# Patient Record
Sex: Female | Born: 1965
Health system: Southern US, Community
[De-identification: ages and names within clinical notes are randomized; demographics above are authoritative.]

## PROBLEM LIST (undated history)

## (undated) DIAGNOSIS — F419 Anxiety disorder, unspecified: Secondary | ICD-10-CM

## (undated) DIAGNOSIS — K529 Noninfective gastroenteritis and colitis, unspecified: Secondary | ICD-10-CM

## (undated) DIAGNOSIS — Z923 Personal history of irradiation: Secondary | ICD-10-CM

## (undated) DIAGNOSIS — T4145XA Adverse effect of unspecified anesthetic, initial encounter: Secondary | ICD-10-CM

## (undated) DIAGNOSIS — Z9221 Personal history of antineoplastic chemotherapy: Secondary | ICD-10-CM

## (undated) DIAGNOSIS — C801 Malignant (primary) neoplasm, unspecified: Secondary | ICD-10-CM

## (undated) DIAGNOSIS — R112 Nausea with vomiting, unspecified: Secondary | ICD-10-CM

## (undated) DIAGNOSIS — Z9889 Other specified postprocedural states: Secondary | ICD-10-CM

## (undated) DIAGNOSIS — D649 Anemia, unspecified: Secondary | ICD-10-CM

## (undated) DIAGNOSIS — F4310 Post-traumatic stress disorder, unspecified: Secondary | ICD-10-CM

## (undated) DIAGNOSIS — C50411 Malignant neoplasm of upper-outer quadrant of right female breast: Secondary | ICD-10-CM

## (undated) DIAGNOSIS — T8859XA Other complications of anesthesia, initial encounter: Secondary | ICD-10-CM

## (undated) DIAGNOSIS — K5190 Ulcerative colitis, unspecified, without complications: Secondary | ICD-10-CM

## (undated) HISTORY — PX: WISDOM TOOTH EXTRACTION: SHX21

## (undated) HISTORY — PX: COLONOSCOPY: SHX174

---

## 1998-11-21 HISTORY — PX: PARTIAL COLECTOMY: SHX5273

## 2002-11-21 HISTORY — PX: BREAST REDUCTION SURGERY: SHX8

## 2005-11-21 HISTORY — PX: ANTERIOR CRUCIATE LIGAMENT REPAIR: SHX115

## 2009-02-11 ENCOUNTER — Encounter: Admission: RE | Admit: 2009-02-11 | Discharge: 2009-03-04 | Payer: Self-pay | Admitting: Family Medicine

## 2009-03-04 ENCOUNTER — Encounter: Admission: RE | Admit: 2009-03-04 | Discharge: 2009-03-04 | Payer: Self-pay | Admitting: Physician Assistant

## 2009-03-24 ENCOUNTER — Encounter: Admission: RE | Admit: 2009-03-24 | Discharge: 2009-03-24 | Payer: Self-pay | Admitting: Obstetrics and Gynecology

## 2009-12-25 ENCOUNTER — Encounter: Admission: RE | Admit: 2009-12-25 | Discharge: 2009-12-25 | Payer: Self-pay | Admitting: Obstetrics and Gynecology

## 2010-06-23 ENCOUNTER — Encounter: Admission: RE | Admit: 2010-06-23 | Discharge: 2010-06-23 | Payer: Self-pay | Admitting: Obstetrics and Gynecology

## 2012-06-04 ENCOUNTER — Other Ambulatory Visit: Payer: Self-pay | Admitting: Obstetrics and Gynecology

## 2012-06-04 DIAGNOSIS — N63 Unspecified lump in unspecified breast: Secondary | ICD-10-CM

## 2012-06-07 ENCOUNTER — Ambulatory Visit
Admission: RE | Admit: 2012-06-07 | Discharge: 2012-06-07 | Disposition: A | Discharge status: Home / Self Care | Payer: 59 | Source: Ambulatory Visit | Attending: Obstetrics and Gynecology | Admitting: Obstetrics and Gynecology

## 2012-06-07 ENCOUNTER — Ambulatory Visit
Admission: RE | Admit: 2012-06-07 | Discharge: 2012-06-07 | Disposition: A | Discharge status: Home / Self Care | Payer: Self-pay | Source: Ambulatory Visit | Attending: Obstetrics and Gynecology | Admitting: Obstetrics and Gynecology

## 2012-06-07 DIAGNOSIS — N63 Unspecified lump in unspecified breast: Secondary | ICD-10-CM

## 2012-11-21 HISTORY — PX: REDUCTION MAMMAPLASTY: SUR839

## 2012-12-18 ENCOUNTER — Other Ambulatory Visit: Payer: Self-pay | Admitting: Internal Medicine

## 2012-12-18 DIAGNOSIS — N644 Mastodynia: Secondary | ICD-10-CM

## 2012-12-28 ENCOUNTER — Ambulatory Visit
Admission: RE | Admit: 2012-12-28 | Discharge: 2012-12-28 | Disposition: A | Discharge status: Home / Self Care | Payer: 59 | Source: Ambulatory Visit | Attending: Internal Medicine | Admitting: Internal Medicine

## 2012-12-28 DIAGNOSIS — N644 Mastodynia: Secondary | ICD-10-CM

## 2014-05-30 ENCOUNTER — Other Ambulatory Visit: Payer: Self-pay | Admitting: Orthopaedic Surgery

## 2014-06-04 ENCOUNTER — Encounter (HOSPITAL_BASED_OUTPATIENT_CLINIC_OR_DEPARTMENT_OTHER): Payer: Self-pay | Admitting: *Deleted

## 2014-06-04 NOTE — H&P (Signed)
Gina Dickson is an 48 y.o. female.   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  She had an ACL reconstruction years ago elsewhere which worked out very well.  Unfortunately a couple of weeks ago she was playing tennis and twisted her knee.  She has been unable to straighten it since.  They had to carry her off the court.  She continues on 2 crutches. She is noted to have a displaced meniscal tear by MRI.  She is having some moderate pain.  Imaging/Tests: I reviewed an MRI scan report only of a study done at [facility] on 05/19/14.  This shows a displaced bucket-handle tear of the medial meniscus with an intact ACL graft.    No past medical history on file.  No past surgical history on file.  No family history on file. Social History:  has no tobacco, alcohol, and drug history on file.  Allergies: No Known Allergies  No prescriptions prior to admission    No results found for this or any previous visit (from the past 48 hour(s)). No results found.  Review of Systems  Musculoskeletal: Positive for joint pain.       Right knee    Height 5\' 5"  (1.651 m), weight 72.576 kg (160 lb). Physical Exam  Constitutional: She is oriented to person, place, and time. She appears well-developed and well-nourished.  HENT:  Head: Normocephalic and atraumatic.  Eyes: Conjunctivae and EOM are normal. Pupils are equal, round, and reactive to light.  Neck: Normal range of motion.  Cardiovascular: Normal rate and regular rhythm.   Respiratory: Effort normal.  GI: Soft.  Musculoskeletal:  Right knee has trace effusion with small incisions consistent with an allograft ACL reconstruction.  Her motion is from about 15-80.  Lachman's test feels tight but she does guard significantly and does not come out in full extension.  Sensation and motor function are intact in her feet with palpable pulses on both sides.  There is no palpable  lymphadenopathy behind her knee.  Neurological: She is alert and oriented to person, place, and time.  Skin: Skin is warm and dry.  Psychiatric: She has a normal mood and affect. Her behavior is normal. Judgment and thought content normal.     Assessment/Plan Assessment:   Right knee displaced meniscal tear with previous allograft ACL reconstruction  Plan: Gina Dickson unfortunately has a locked knee.  There is not much of an alternative to arthroscopic intervention.  I told her our plan will be to perform a partial medial meniscectomy versus repair.  I doubt her ACL is torn but if it is we would not reconstruct at the same time and would simply have her go through some rehabilitation until she got full motion back and then perhaps reconstruct.  I really doubt that is going to be necessary.  I reviewed risk of anesthesia and infection related to a knee arthroscopy.She will continue on crutches until surgery.  Gina Dickson, Larwance Sachs 06/04/2014, 10:46 AM

## 2014-06-04 NOTE — Progress Notes (Signed)
Surgery chg-son coming-mom was flying in later-no labs needed-to bring crutches

## 2014-06-06 ENCOUNTER — Encounter (HOSPITAL_COMMUNITY): Payer: Self-pay | Admitting: Pharmacy Technician

## 2014-06-09 ENCOUNTER — Inpatient Hospital Stay (HOSPITAL_COMMUNITY): Admission: RE | Admit: 2014-06-09 | Payer: 59 | Source: Ambulatory Visit

## 2014-06-09 ENCOUNTER — Ambulatory Visit (HOSPITAL_BASED_OUTPATIENT_CLINIC_OR_DEPARTMENT_OTHER)
Admission: RE | Admit: 2014-06-09 | Discharge: 2014-06-09 | Disposition: A | Discharge status: Home / Self Care | Payer: 59 | Source: Ambulatory Visit | Attending: Orthopaedic Surgery | Admitting: Orthopaedic Surgery

## 2014-06-09 ENCOUNTER — Ambulatory Visit (HOSPITAL_BASED_OUTPATIENT_CLINIC_OR_DEPARTMENT_OTHER): Payer: 59 | Admitting: Anesthesiology

## 2014-06-09 ENCOUNTER — Encounter (HOSPITAL_BASED_OUTPATIENT_CLINIC_OR_DEPARTMENT_OTHER)
Admission: RE | Disposition: A | Discharge status: Home / Self Care | Payer: Self-pay | Source: Ambulatory Visit | Attending: Orthopaedic Surgery

## 2014-06-09 ENCOUNTER — Encounter (HOSPITAL_BASED_OUTPATIENT_CLINIC_OR_DEPARTMENT_OTHER): Payer: Self-pay

## 2014-06-09 ENCOUNTER — Encounter (HOSPITAL_BASED_OUTPATIENT_CLINIC_OR_DEPARTMENT_OTHER): Payer: 59 | Admitting: Anesthesiology

## 2014-06-09 DIAGNOSIS — Y9239 Other specified sports and athletic area as the place of occurrence of the external cause: Secondary | ICD-10-CM | POA: Insufficient documentation

## 2014-06-09 DIAGNOSIS — IMO0002 Reserved for concepts with insufficient information to code with codable children: Secondary | ICD-10-CM | POA: Insufficient documentation

## 2014-06-09 DIAGNOSIS — M2240 Chondromalacia patellae, unspecified knee: Secondary | ICD-10-CM | POA: Insufficient documentation

## 2014-06-09 DIAGNOSIS — Y92838 Other recreation area as the place of occurrence of the external cause: Secondary | ICD-10-CM

## 2014-06-09 DIAGNOSIS — X500XXA Overexertion from strenuous movement or load, initial encounter: Secondary | ICD-10-CM | POA: Insufficient documentation

## 2014-06-09 DIAGNOSIS — S83206A Unspecified tear of unspecified meniscus, current injury, right knee, initial encounter: Secondary | ICD-10-CM

## 2014-06-09 DIAGNOSIS — Y9379 Activity, other specified sports and athletics: Secondary | ICD-10-CM | POA: Insufficient documentation

## 2014-06-09 HISTORY — PX: KNEE ARTHROSCOPY WITH MEDIAL MENISECTOMY: SHX5651

## 2014-06-09 HISTORY — DX: Noninfective gastroenteritis and colitis, unspecified: K52.9

## 2014-06-09 LAB — POCT HEMOGLOBIN-HEMACUE: Hemoglobin: 12.1 g/dL (ref 12.0–15.0)

## 2014-06-09 SURGERY — ARTHROSCOPY, KNEE, WITH MEDIAL MENISCECTOMY
Anesthesia: General | Site: Knee | Laterality: Right

## 2014-06-09 MED ORDER — SUCCINYLCHOLINE CHLORIDE 20 MG/ML IJ SOLN
INTRAMUSCULAR | Status: AC
  Filled 2014-06-09: qty 1

## 2014-06-09 MED ORDER — OXYCODONE HCL 5 MG/5ML PO SOLN: 5.0000 mg | Freq: Once | ORAL | Status: DC | PRN

## 2014-06-09 MED ORDER — CHLORHEXIDINE GLUCONATE 4 % EX LIQD: 60.0000 mL | Freq: Once | CUTANEOUS | Status: DC

## 2014-06-09 MED ORDER — FENTANYL CITRATE 0.05 MG/ML IJ SOLN
INTRAMUSCULAR | Status: AC
  Filled 2014-06-09: qty 6

## 2014-06-09 MED ORDER — BUPIVACAINE-EPINEPHRINE 0.5% -1:200000 IJ SOLN
INTRAMUSCULAR | Status: DC | PRN
  Administered 2014-06-09: 20 mL

## 2014-06-09 MED ORDER — ONDANSETRON HCL 4 MG/2ML IJ SOLN
4.0000 mg | Freq: Once | INTRAMUSCULAR | Status: AC | PRN
  Administered 2014-06-09: 4 mg via INTRAVENOUS

## 2014-06-09 MED ORDER — FENTANYL CITRATE 0.05 MG/ML IJ SOLN: 50.0000 ug | INTRAMUSCULAR | Status: DC | PRN

## 2014-06-09 MED ORDER — FENTANYL CITRATE 0.05 MG/ML IJ SOLN
INTRAMUSCULAR | Status: DC | PRN
  Administered 2014-06-09: 100 ug via INTRAVENOUS
  Administered 2014-06-09: 50 ug via INTRAVENOUS

## 2014-06-09 MED ORDER — ONDANSETRON HCL 4 MG/2ML IJ SOLN
INTRAMUSCULAR | Status: AC
  Filled 2014-06-09: qty 2

## 2014-06-09 MED ORDER — LIDOCAINE HCL (CARDIAC) 20 MG/ML IV SOLN
INTRAVENOUS | Status: DC | PRN
  Administered 2014-06-09: 100 mg via INTRAVENOUS

## 2014-06-09 MED ORDER — CEFAZOLIN SODIUM-DEXTROSE 2-3 GM-% IV SOLR
2.0000 g | INTRAVENOUS | Status: AC
  Administered 2014-06-09: 2 g via INTRAVENOUS

## 2014-06-09 MED ORDER — BUPIVACAINE-EPINEPHRINE (PF) 0.5% -1:200000 IJ SOLN
INTRAMUSCULAR | Status: AC
  Filled 2014-06-09: qty 30

## 2014-06-09 MED ORDER — CEFAZOLIN SODIUM-DEXTROSE 2-3 GM-% IV SOLR
INTRAVENOUS | Status: AC
  Filled 2014-06-09: qty 50

## 2014-06-09 MED ORDER — PROPOFOL 10 MG/ML IV BOLUS
INTRAVENOUS | Status: DC | PRN
  Administered 2014-06-09: 150 mg via INTRAVENOUS

## 2014-06-09 MED ORDER — SCOPOLAMINE 1 MG/3DAYS TD PT72
MEDICATED_PATCH | TRANSDERMAL | Status: AC
  Filled 2014-06-09: qty 1

## 2014-06-09 MED ORDER — LACTATED RINGERS IV SOLN
INTRAVENOUS | Status: DC
  Administered 2014-06-09: 10 mL/h via INTRAVENOUS
  Administered 2014-06-09 (×2): via INTRAVENOUS

## 2014-06-09 MED ORDER — MIDAZOLAM HCL 2 MG/2ML IJ SOLN
INTRAMUSCULAR | Status: AC
  Filled 2014-06-09: qty 2

## 2014-06-09 MED ORDER — HYDROCODONE-ACETAMINOPHEN 5-325 MG PO TABS: 1.0000 | ORAL_TABLET | Freq: Four times a day (QID) | ORAL | Status: DC | PRN

## 2014-06-09 MED ORDER — HYDROMORPHONE HCL PF 1 MG/ML IJ SOLN
INTRAMUSCULAR | Status: AC
  Filled 2014-06-09: qty 1

## 2014-06-09 MED ORDER — MEPERIDINE HCL 25 MG/ML IJ SOLN: 6.2500 mg | INTRAMUSCULAR | Status: DC | PRN

## 2014-06-09 MED ORDER — MIDAZOLAM HCL 2 MG/2ML IJ SOLN: 1.0000 mg | INTRAMUSCULAR | Status: DC | PRN

## 2014-06-09 MED ORDER — DEXAMETHASONE SODIUM PHOSPHATE 4 MG/ML IJ SOLN
INTRAMUSCULAR | Status: DC | PRN
  Administered 2014-06-09: 10 mg via INTRAVENOUS

## 2014-06-09 MED ORDER — HYDROMORPHONE HCL PF 1 MG/ML IJ SOLN
0.2500 mg | INTRAMUSCULAR | Status: DC | PRN
  Administered 2014-06-09: 0.5 mg via INTRAVENOUS
  Administered 2014-06-09: 0.25 mg via INTRAVENOUS

## 2014-06-09 MED ORDER — OXYCODONE HCL 5 MG PO TABS: 5.0000 mg | ORAL_TABLET | Freq: Once | ORAL | Status: DC | PRN

## 2014-06-09 MED ORDER — ONDANSETRON HCL 4 MG/2ML IJ SOLN
INTRAMUSCULAR | Status: DC | PRN
  Administered 2014-06-09: 4 mg via INTRAVENOUS

## 2014-06-09 MED ORDER — METHYLPREDNISOLONE ACETATE 40 MG/ML IJ SUSP
INTRAMUSCULAR | Status: AC
  Filled 2014-06-09: qty 1

## 2014-06-09 MED ORDER — LACTATED RINGERS IV SOLN
INTRAVENOUS | Status: DC
  Administered 2014-06-09: 07:00:00 via INTRAVENOUS

## 2014-06-09 MED ORDER — SODIUM CHLORIDE 0.9 % IR SOLN
Status: DC | PRN
  Administered 2014-06-09: 6000 mL

## 2014-06-09 MED ORDER — METHYLPREDNISOLONE ACETATE 80 MG/ML IJ SUSP
INTRAMUSCULAR | Status: AC
  Filled 2014-06-09: qty 1

## 2014-06-09 MED ORDER — PROPOFOL 10 MG/ML IV EMUL
INTRAVENOUS | Status: AC
  Filled 2014-06-09: qty 50

## 2014-06-09 MED ORDER — MIDAZOLAM HCL 5 MG/5ML IJ SOLN
INTRAMUSCULAR | Status: DC | PRN
  Administered 2014-06-09: 2 mg via INTRAVENOUS

## 2014-06-09 SURGICAL SUPPLY — 46 items
BANDAGE ELASTIC 6 VELCRO ST LF (GAUZE/BANDAGES/DRESSINGS) ×2 IMPLANT
BLADE CUDA 5.5 (BLADE) IMPLANT
BLADE CUDA GRT WHITE 3.5 (BLADE) ×2 IMPLANT
BLADE GREAT WHITE 4.2 (BLADE) ×2 IMPLANT
BNDG GAUZE ELAST 4 BULKY (GAUZE/BANDAGES/DRESSINGS) ×2 IMPLANT
CANISTER SUCT 3000ML (MISCELLANEOUS) IMPLANT
DRAPE ARTHROSCOPY W/POUCH 114 (DRAPES) ×2 IMPLANT
DRAPE U-SHAPE 47X51 STRL (DRAPES) ×2 IMPLANT
DRSG EMULSION OIL 3X3 NADH (GAUZE/BANDAGES/DRESSINGS) ×2 IMPLANT
DURAPREP 26ML APPLICATOR (WOUND CARE) IMPLANT
ELECT MENISCUS 165MM 90D (ELECTRODE) IMPLANT
ELECT REM PT RETURN 9FT ADLT (ELECTROSURGICAL)
ELECTRODE REM PT RTRN 9FT ADLT (ELECTROSURGICAL) IMPLANT
GAUZE SPONGE 4X4 12PLY STRL (GAUZE/BANDAGES/DRESSINGS) ×2 IMPLANT
GLOVE BIO SURGEON STRL SZ 6.5 (GLOVE) ×2 IMPLANT
GLOVE BIO SURGEON STRL SZ8 (GLOVE) ×4 IMPLANT
GLOVE BIO SURGEON STRL SZ8.5 (GLOVE) IMPLANT
GLOVE BIOGEL PI IND STRL 7.0 (GLOVE) ×1 IMPLANT
GLOVE BIOGEL PI IND STRL 8 (GLOVE) ×2 IMPLANT
GLOVE BIOGEL PI IND STRL 8.5 (GLOVE) IMPLANT
GLOVE BIOGEL PI INDICATOR 7.0 (GLOVE) ×1
GLOVE BIOGEL PI INDICATOR 8 (GLOVE) ×2
GLOVE BIOGEL PI INDICATOR 8.5 (GLOVE)
GLOVE EXAM NITRILE MD LF STRL (GLOVE) ×2 IMPLANT
GLOVE SS BIOGEL STRL SZ 8 (GLOVE) IMPLANT
GLOVE SUPERSENSE BIOGEL SZ 8 (GLOVE)
GLOVE SURG SS PI 8.0 STRL IVOR (GLOVE) IMPLANT
GOWN STRL REUS W/ TWL LRG LVL3 (GOWN DISPOSABLE) ×1 IMPLANT
GOWN STRL REUS W/ TWL XL LVL3 (GOWN DISPOSABLE) ×2 IMPLANT
GOWN STRL REUS W/TWL LRG LVL3 (GOWN DISPOSABLE) ×1
GOWN STRL REUS W/TWL XL LVL3 (GOWN DISPOSABLE) ×2
IV NS IRRIG 3000ML ARTHROMATIC (IV SOLUTION) ×4 IMPLANT
KNEE WRAP E Z 3 GEL PACK (MISCELLANEOUS) ×2 IMPLANT
MANIFOLD NEPTUNE II (INSTRUMENTS) ×2 IMPLANT
PACK ARTHROSCOPY DSU (CUSTOM PROCEDURE TRAY) ×2 IMPLANT
PACK BASIN DAY SURGERY FS (CUSTOM PROCEDURE TRAY) ×2 IMPLANT
PENCIL BUTTON HOLSTER BLD 10FT (ELECTRODE) IMPLANT
SET ARTHROSCOPY TUBING (MISCELLANEOUS) ×1
SET ARTHROSCOPY TUBING LN (MISCELLANEOUS) ×1 IMPLANT
SHEET MEDIUM DRAPE 40X70 STRL (DRAPES) ×2 IMPLANT
SYRINGE 3CC/18X1.5 ECLIPSE (MISCELLANEOUS) IMPLANT
TOWEL OR 17X24 6PK STRL BLUE (TOWEL DISPOSABLE) ×2 IMPLANT
TOWEL OR NON WOVEN STRL DISP B (DISPOSABLE) IMPLANT
WAND 30 DEG SABER W/CORD (SURGICAL WAND) IMPLANT
WAND STAR VAC 90 (SURGICAL WAND) IMPLANT
WATER STERILE IRR 1000ML POUR (IV SOLUTION) ×2 IMPLANT

## 2014-06-09 NOTE — Interval H&P Note (Signed)
History and Physical Interval Note:  06/09/2014 7:22 AM  Gina Dickson  has presented today for surgery, with the diagnosis of RIGHT KNEE MEDIAL MENISCUS TEAR/CHONDROMALACIA  The various methods of treatment have been discussed with the patient and family. After consideration of risks, benefits and other options for treatment, the patient has consented to  Procedure(s): RIGHT ARTHROSCOPY KNEE (Right) as a surgical intervention .  The patient's history has been reviewed, patient examined, no change in status, stable for surgery.  I have reviewed the patient's chart and labs.  Questions were answered to the patient's satisfaction.     Gina Dickson

## 2014-06-09 NOTE — Discharge Instructions (Signed)

## 2014-06-09 NOTE — Op Note (Signed)
NAMEVerlin Dickson NO.:  [id number]  MEDICAL RECORD NO.:  [id number]  LOCATION:                                 FACILITY:  PHYSICIAN:  Monico Blitz. Vyncent Overby, M.D.DATE OF BIRTH:  07-27-1966  DATE OF PROCEDURE:  06/09/2014 DATE OF DISCHARGE:  06/09/2014                              OPERATIVE REPORT   PREOPERATIVE DIAGNOSES: 1. Right knee torn medial meniscus. 2. Right knee chondromalacia patella.  POSTOPERATIVE DIAGNOSES: 1. Right knee torn medial meniscus. 2. Right knee chondromalacia patella.  PROCEDURE: 1. Right knee partial medial meniscectomy. 2. Right knee abrasion chondroplasty patellofemoral.  ANESTHESIA:  General.  ATTENDING SURGEON:  Monico Blitz. Rhona Raider, MD  ASSISTANT:  Loni Dolly, PA  INDICATION FOR PROCEDURE:  The patient is a 48 year old woman with a history of an ACL reconstruction which worked out well many years ago. Unfortunately, she was playing tennis recently and twisted her knee. She has been left with a locked knee over the last couple of weeks.  She cannot get to last at least 10 degrees of extension.  This causes significant pain.  She underwent an MRI scan, which showed a displaced medial meniscus tear, with an intact ACL graft.  She is offered an arthroscopy.  Informed operative consent was obtained after discussion of possible complications including reaction to anesthesia and infection.  SUMMARY OF FINDINGS AND PROCEDURE:  Under general anesthesia, an arthroscopy of the right knee was performed.  Suprapatellar pouch was benign while the patellofemoral joint exhibited some focal breakdown in the intertrochlear groove addressed with chondroplasty and abrasion to bleeding bone in a tiny area.  The patella tracked well.  The medial compartment exhibited a completely displaced bucket-handle tear of the medial meniscus.  This was stuck in an anterior position and was not reducible.  I performed about a 25% partial medial  meniscectomy removing this entire portion of the meniscus back to a stable posterior rim.  The underlying ACL graft appeared to be intact and was functional to testing.  The PCL was normal.  The lateral compartment exhibited no evidence of meniscal or articular cartilage injury.  She was discharged home the same day.  DESCRIPTION OF PROCEDURE:  The patient was taken to the operating suite where general anesthetic was applied without difficulty.  She was positioned supine and prepped and draped in normal sterile fashion. After the administration of preop IV Kefzol and an appropriate time out, an arthroscopy of the right knee was performed through total of 2 portals.  Findings were as noted above and procedure consisted predominantly of the partial medial meniscectomy which was done with basket and shaver.  We also performed the abrasion chondroplasty patellofemoral described above.  The knee was thoroughly irrigated followed by placement of Marcaine with epinephrine.  Adaptic was placed over the portals followed by dry gauze and a loose Ace wrap.  Estimated blood loss and intraoperative fluids can beobtained from anesthesia records.  DISPOSITION:  The patient was extubated in the operating room and taken to recovery room in stable condition.  She was to go home the same-day and follow up in the office in less than a week.  I will contact her by  phone tonight.     Monico Blitz Rhona Raider, M.D.     PGD/MEDQ  D:  06/09/2014  T:  06/09/2014  Job:  [id number]

## 2014-06-09 NOTE — Anesthesia Procedure Notes (Signed)
Procedure Name: LMA Insertion Date/Time: 06/09/2014 7:38 AM Performed by: Melynda Ripple D Pre-anesthesia Checklist: Patient identified, Emergency Drugs available, Suction available and Patient being monitored Patient Re-evaluated:Patient Re-evaluated prior to inductionOxygen Delivery Method: Circle System Utilized Preoxygenation: Pre-oxygenation with 100% oxygen Intubation Type: IV induction Ventilation: Mask ventilation without difficulty LMA: LMA inserted LMA Size: 4.0 Number of attempts: 1 Airway Equipment and Method: bite block Placement Confirmation: positive ETCO2 Tube secured with: Tape Dental Injury: Teeth and Oropharynx as per pre-operative assessment

## 2014-06-09 NOTE — Anesthesia Preprocedure Evaluation (Signed)
Anesthesia Evaluation  Patient identified by MRN, date of birth, ID band Patient awake    Reviewed: Allergy & Precautions, H&P , NPO status , Patient's Chart, lab work & pertinent test results  History of Anesthesia Complications (+) PONV  Airway Mallampati: I TM Distance: >3 FB Neck ROM: Full    Dental   Pulmonary          Cardiovascular     Neuro/Psych    GI/Hepatic   Endo/Other    Renal/GU      Musculoskeletal   Abdominal   Peds  Hematology   Anesthesia Other Findings   Reproductive/Obstetrics                           Anesthesia Physical Anesthesia Plan  ASA: II  Anesthesia Plan: General   Post-op Pain Management:    Induction: Intravenous  Airway Management Planned: LMA  Additional Equipment:   Intra-op Plan:   Post-operative Plan: Extubation in OR  Informed Consent: I have reviewed the patients History and Physical, chart, labs and discussed the procedure including the risks, benefits and alternatives for the proposed anesthesia with the patient or authorized representative who has indicated his/her understanding and acceptance.     Plan Discussed with: CRNA and Surgeon  Anesthesia Plan Comments:         Anesthesia Quick Evaluation

## 2014-06-09 NOTE — Anesthesia Postprocedure Evaluation (Signed)
Anesthesia Post Note  Patient: Gina Dickson  Procedure(s) Performed: Procedure(s) (LRB): KNEE ARTHROSCOPY WITH PARTIAL MEDIAL MENISECTOMY, CHONDROPLASTY (Right)  Anesthesia type: general  Patient location: PACU  Post pain: Pain level controlled  Post assessment: Patient's Cardiovascular Status Stable  Last Vitals:  Filed Vitals:   06/09/14 1030  BP: 98/62  Pulse:   Temp: 36.4 C  Resp: 16    Post vital signs: Reviewed and stable  Level of consciousness: sedated  Complications: No apparent anesthesia complications

## 2014-06-09 NOTE — Transfer of Care (Signed)
Immediate Anesthesia Transfer of Care Note  Patient: Gina Dickson  Procedure(s) Performed: Procedure(s): KNEE ARTHROSCOPY WITH PARTIAL MEDIAL MENISECTOMY, CHONDROPLASTY (Right)  Patient Location: PACU  Anesthesia Type:General  Level of Consciousness: awake, alert  and oriented  Airway & Oxygen Therapy: Patient Spontanous Breathing and Patient connected to nasal cannula oxygen  Post-op Assessment: Report given to PACU RN and Post -op Vital signs reviewed and stable  Post vital signs: Reviewed and stable  Complications: No apparent anesthesia complications

## 2014-06-09 NOTE — Op Note (Signed)
#  [id number] 

## 2014-06-10 ENCOUNTER — Encounter (HOSPITAL_BASED_OUTPATIENT_CLINIC_OR_DEPARTMENT_OTHER): Payer: Self-pay | Admitting: Orthopaedic Surgery

## 2014-06-12 ENCOUNTER — Ambulatory Visit (HOSPITAL_COMMUNITY): Admission: RE | Admit: 2014-06-12 | Payer: 59 | Source: Ambulatory Visit | Admitting: Orthopaedic Surgery

## 2014-06-12 ENCOUNTER — Encounter (HOSPITAL_COMMUNITY): Admission: RE | Payer: Self-pay | Source: Ambulatory Visit

## 2014-06-12 SURGERY — ARTHROSCOPY, KNEE
Anesthesia: Choice | Site: Knee | Laterality: Right

## 2016-09-09 ENCOUNTER — Other Ambulatory Visit: Payer: Self-pay | Admitting: Obstetrics and Gynecology

## 2016-09-09 DIAGNOSIS — R928 Other abnormal and inconclusive findings on diagnostic imaging of breast: Secondary | ICD-10-CM

## 2016-09-19 ENCOUNTER — Ambulatory Visit
Admission: RE | Admit: 2016-09-19 | Discharge: 2016-09-19 | Disposition: A | Discharge status: Home / Self Care | Payer: Managed Care, Other (non HMO) | Source: Ambulatory Visit | Attending: Obstetrics and Gynecology | Admitting: Obstetrics and Gynecology

## 2016-09-19 ENCOUNTER — Other Ambulatory Visit: Payer: Self-pay | Admitting: Obstetrics and Gynecology

## 2016-09-19 DIAGNOSIS — R928 Other abnormal and inconclusive findings on diagnostic imaging of breast: Secondary | ICD-10-CM

## 2016-09-19 DIAGNOSIS — N6310 Unspecified lump in the right breast, unspecified quadrant: Secondary | ICD-10-CM

## 2016-09-22 ENCOUNTER — Ambulatory Visit
Admission: RE | Admit: 2016-09-22 | Discharge: 2016-09-22 | Disposition: A | Discharge status: Home / Self Care | Payer: Managed Care, Other (non HMO) | Source: Ambulatory Visit | Attending: Obstetrics and Gynecology | Admitting: Obstetrics and Gynecology

## 2016-09-22 ENCOUNTER — Other Ambulatory Visit: Payer: Self-pay | Admitting: Obstetrics and Gynecology

## 2016-09-22 DIAGNOSIS — N6310 Unspecified lump in the right breast, unspecified quadrant: Secondary | ICD-10-CM

## 2016-09-26 ENCOUNTER — Encounter: Payer: Self-pay | Admitting: General Surgery

## 2016-09-29 ENCOUNTER — Other Ambulatory Visit: Payer: Self-pay | Admitting: General Surgery

## 2016-09-29 DIAGNOSIS — C50411 Malignant neoplasm of upper-outer quadrant of right female breast: Secondary | ICD-10-CM

## 2016-09-29 DIAGNOSIS — Z170 Estrogen receptor positive status [ER+]: Secondary | ICD-10-CM

## 2016-10-02 NOTE — H&P (Signed)
Gina Dickson  Location: [facility] Patient #: [id number] DOB: 06/07/1966 Single / Language: Cleophus Molt / Race: White Female        History of Present Illness       This is a very pleasant 50 year old Caucasian female, referred by Dr. Leonides Sake at the [facility] for evaluation of a newly diagnosed cancer in the upper outer quadrant of the right breast. Dr. [name] added Andree Elk farmers her PCP. This morning she saw Dr. Verdell Carmine, no one help oncology specialists in [place]. I have her note.       The patient has had bilateral reduction mammoplasties in the past 2005,but no pathologic breast disease. She gets annual mammograms. Recent screening mammogram and ultrasound on the right showed category C density, 7 mm mass seen at the 10 o'clock position 3 cm from the nipple. Axilla was negative. 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 She has done well since the biopsy. She was presented in our city wide multidisciplinary tumor board this past Wednesday, yesterday. She takes estrogens and has been told to discontinue that by me and by Dr. Georgiann Cocker.       Comorbidities include bilateral reduction mammoplasty 2005. Total colectomy with J-pouch for ulcerative colitis in [facility] and has done well. C-section through Pfannenstiel incision. ACL repair.      Family history reveals a maternal aunt had breast cancer at age 40. Sister had cervical cancer 45. Father had multiple myeloma a 61. Paternal grandfather had gastric cancer. Dr. Georgiann Cocker is going to have this reviewed by her genetic counselor.  Social history reveals she is separated. Has 3 children. Lives in [place]. 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 Denies tobacco. Alcohol rarely.       We did talk about the options for surgical intervention. She knows that surgery  is the next step in her care. We talked about the difference between mastectomy with or without reconstruction, lumpectomy, and sentinel node biopsy. She is motivated for breast conservation and I think she is an excellent candidate for that.       She'll be scheduled for right breast lumpectomy with radioactive seed localization, right axillary sentinel lymph node biopsy. It is possible that we may be able to do this through a circumareolar incision which would be nice cosmetically. I told her I was not sure. I discussed the indications, details, techniques, and numerous risk of the surgery with her. She is aware of the risk of bleeding, infection, cosmetic deformity, nerve damage and chronic pain, reoperation for positive margins or multiple positive nodes. She has that she will probably be offered a whole breast radiation therapy and antiestrogen therapy. Oncotype testing can be considered if the tumor is greater than 1 cm. She understands all of these issues. All of her questions were answered. She agrees with this plan.   Other Problems Breast Cancer Gastric Ulcer Lump In Breast Ulcerative Colitis Umbilical Hernia Repair  Past Surgical History  Breast Biopsy Right. Cesarean Section - 1 Colon Polyp Removal - Colonoscopy Colon Removal - Complete Knee Surgery Right. Mammoplasty; Reduction Bilateral.  Diagnostic Studies History  Colonoscopy 1-5 years ago Mammogram within last year Pap Smear 1-5 years ago  Allergies  Morphine Sulfate *ANALGESICS - OPIOID* Erythrocin Lactobionate *MACROLIDES* COPOLYMER-1  Medication History  Multi-Day (Oral) Active. ZyrTEC Allergy (10MG Tablet, Oral) Active. Fish Oil (Oral) Specific dose  unknown - Active. Magnesium (Oral) Specific dose unknown - Active. Calcium (Oral) Specific dose unknown - Active. Medications Reconciled  Social History  Alcohol use Occasional alcohol use. Caffeine use Coffee, Tea. No drug  use Tobacco use Never smoker.  Family History  Alcohol Abuse Father. Cancer Father. Cervical Cancer Sister. Heart disease in female family member before age 39 Hypertension Father. Ischemic Bowel Disease Father.  Pregnancy / Birth History  Age at menarche 70 years. Contraceptive History Oral contraceptives. Gravida 3 Length (months) of breastfeeding 3-6 Maternal age 35-30 Para 3 Regular periods    Review of Systems  General Present- Night Sweats. Not Present- Appetite Loss, Chills, Fatigue, Fever, Weight Gain and Weight Loss. Skin Not Present- Change in Wart/Mole, Dryness, Hives, Jaundice, New Lesions, Non-Healing Wounds, Rash and Ulcer. HEENT Present- Seasonal Allergies and Wears glasses/contact lenses. Not Present- Earache, Hearing Loss, Hoarseness, Nose Bleed, Oral Ulcers, Ringing in the Ears, Sinus Pain, Sore Throat, Visual Disturbances and Yellow Eyes. Breast Present- Breast Mass. Not Present- Breast Pain, Nipple Discharge and Skin Changes. Cardiovascular Not Present- Chest Pain, Difficulty Breathing Lying Down, Leg Cramps, Palpitations, Rapid Heart Rate, Shortness of Breath and Swelling of Extremities. Gastrointestinal Not Present- Abdominal Pain, Bloating, Bloody Stool, Change in Bowel Habits, Chronic diarrhea, Constipation, Difficulty Swallowing, Excessive gas, Gets full quickly at meals, Hemorrhoids, Indigestion, Nausea, Rectal Pain and Vomiting. Female Genitourinary Not Present- Frequency, Nocturia, Painful Urination, Pelvic Pain and Urgency. Musculoskeletal Not Present- Back Pain, Joint Pain, Joint Stiffness, Muscle Pain, Muscle Weakness and Swelling of Extremities. Neurological Present- Headaches. Not Present- Decreased Memory, Fainting, Numbness, Seizures, Tingling, Tremor, Trouble walking and Weakness. Psychiatric Not Present- Anxiety, Bipolar, Change in Sleep Pattern, Depression, Fearful and Frequent crying. Endocrine Not Present- Cold Intolerance,  Excessive Hunger, Hair Changes, Heat Intolerance, Hot flashes and New Diabetes. Hematology Not Present- Blood Thinners, Easy Bruising, Excessive bleeding, Gland problems, HIV and Persistent Infections.  Vitals  Weight: 163 lb Height: 65in Body Surface Area: 1.81 m Body Mass Index: 27.12 kg/m  Pulse: 80 (Regular)  BP: 110/64 (Sitting, Left Arm, Standard)    Physical Exam General Mental Status-Alert. General Appearance-Consistent with stated age. Hydration-Well hydrated. Voice-Normal.  Head and Neck Head-normocephalic, atraumatic with no lesions or palpable masses. Trachea-midline. Thyroid Gland Characteristics - normal size and consistency.  Eye Eyeball - Bilateral-Extraocular movements intact. Sclera/Conjunctiva - Bilateral-No scleral icterus.  Chest and Lung Exam Chest and lung exam reveals -quiet, even and easy respiratory effort with no use of accessory muscles and on auscultation, normal breath sounds, no adventitious sounds and normal vocal resonance. Inspection Chest Wall - Normal. Back - normal.  Breast Note: Breasts are medium size. Bilateral reduction mammoplasty scars. Good cosmetic result with reasonable symmetry. Biopsy site on the right. No palpable mass or hematoma. No other skin changes. No axillary adenopathy on either side.   Cardiovascular Cardiovascular examination reveals -normal heart sounds, regular rate and rhythm with no murmurs and normal pedal pulses bilaterally.  Abdomen Inspection Inspection of the abdomen reveals - No Hernias. Skin - Scar - Note: Long midline scar from total colectomy. Pfannenstiel incision. Palpation/Percussion Palpation and Percussion of the abdomen reveal - Soft, Non Tender, No Rebound tenderness, No Rigidity (guarding) and No hepatosplenomegaly. Auscultation Auscultation of the abdomen reveals - Bowel sounds normal.  Neurologic Neurologic evaluation reveals -alert and oriented x 3 with  no impairment of recent or remote memory. Mental Status-Normal.  Musculoskeletal Normal Exam - Left-Upper Extremity Strength Normal and Lower Extremity Strength Normal. Normal Exam - Right-Upper Extremity Strength Normal and  Lower Extremity Strength Normal.  Lymphatic Head & Neck  General Head & Neck Lymphatics: Bilateral - Description - Normal. Axillary  General Axillary Region: Bilateral - Description - Normal. Tenderness - Non Tender. Femoral & Inguinal  Generalized Femoral & Inguinal Lymphatics: Bilateral - Description - Normal. Tenderness - Non Tender.    Assessment & Plan PRIMARY CANCER OF UPPER OUTER QUADRANT OF RIGHT FEMALE BREAST (C50.411)   Your recent imaging studies and biopsy show a 7 mm invasive ductal carcinoma in the right breast, upper outer quadrant, 10 o'clock position, 3 cm from the nipple. Ultrasound of the right axilla is negative for obviously enlarged lymph nodes. Your tumor is estrogen receptor positive, HER-2 negative.  This morning you saw Verdell Carmine, [facility] medical oncology, [place]. I will send her a copy of my notes  The next step in your treatment is surgery. Other decisions about radiation therapy, antiestrogen pills, or even chemotherapy will be determined after the surgery is completed. We have discussed the differences between lumpectomy and mastectomy with or without reconstruction as well as sentinel lymph node biopsy. We both agree that we would like to proceed with breast conservation surgery  You will be scheduled for right breast lumpectomy with radioactive seed localization and right axillary sentinel lymph node biopsy I have discussed the indications, techniques, and numerous risk of the surgery Please read the printed information that we have given you.  OTHER ULCERATIVE COLITIS WITH OTHER COMPLICATION (T05.697) Impression: History total proctocolectomy with J-pouch, [place]. HISTORY OF C-SECTION (X48.016) HISTORY  OF BILATERAL BREAST REDUCTION SURGERY (Z98.890) Impression: 2005 FAMILY HISTORY OF BREAST CANCER IN FEMALE (Z80.3) Impression: Maternal aunt, age 42, postmenopausal  Caramia Boutin M. Dalbert Batman, M.D., [facility] General and Minimally invasive Surgery Breast and Colorectal Surgery Office:   [phone] Pager:   [phone]

## 2016-10-04 ENCOUNTER — Encounter (HOSPITAL_COMMUNITY): Payer: Self-pay

## 2016-10-04 ENCOUNTER — Encounter (HOSPITAL_COMMUNITY)
Admission: RE | Admit: 2016-10-04 | Discharge: 2016-10-04 | Disposition: A | Discharge status: Home / Self Care | Payer: Managed Care, Other (non HMO) | Source: Ambulatory Visit | Attending: General Surgery | Admitting: General Surgery

## 2016-10-04 DIAGNOSIS — Z803 Family history of malignant neoplasm of breast: Secondary | ICD-10-CM | POA: Diagnosis not present

## 2016-10-04 DIAGNOSIS — Z800 Family history of malignant neoplasm of digestive organs: Secondary | ICD-10-CM | POA: Diagnosis not present

## 2016-10-04 DIAGNOSIS — Z886 Allergy status to analgesic agent status: Secondary | ICD-10-CM | POA: Diagnosis not present

## 2016-10-04 DIAGNOSIS — Z9049 Acquired absence of other specified parts of digestive tract: Secondary | ICD-10-CM | POA: Diagnosis not present

## 2016-10-04 DIAGNOSIS — Z8719 Personal history of other diseases of the digestive system: Secondary | ICD-10-CM | POA: Diagnosis not present

## 2016-10-04 DIAGNOSIS — D649 Anemia, unspecified: Secondary | ICD-10-CM | POA: Diagnosis not present

## 2016-10-04 DIAGNOSIS — Z86010 Personal history of colonic polyps: Secondary | ICD-10-CM | POA: Diagnosis not present

## 2016-10-04 DIAGNOSIS — Z888 Allergy status to other drugs, medicaments and biological substances status: Secondary | ICD-10-CM | POA: Diagnosis not present

## 2016-10-04 DIAGNOSIS — K5190 Ulcerative colitis, unspecified, without complications: Secondary | ICD-10-CM | POA: Diagnosis not present

## 2016-10-04 DIAGNOSIS — Z170 Estrogen receptor positive status [ER+]: Secondary | ICD-10-CM | POA: Diagnosis not present

## 2016-10-04 DIAGNOSIS — Z8049 Family history of malignant neoplasm of other genital organs: Secondary | ICD-10-CM | POA: Diagnosis not present

## 2016-10-04 DIAGNOSIS — C50411 Malignant neoplasm of upper-outer quadrant of right female breast: Secondary | ICD-10-CM | POA: Diagnosis present

## 2016-10-04 DIAGNOSIS — Z885 Allergy status to narcotic agent status: Secondary | ICD-10-CM | POA: Diagnosis not present

## 2016-10-04 HISTORY — DX: Adverse effect of unspecified anesthetic, initial encounter: T41.45XA

## 2016-10-04 HISTORY — DX: Other complications of anesthesia, initial encounter: T88.59XA

## 2016-10-04 HISTORY — DX: Other specified postprocedural states: Z98.890

## 2016-10-04 HISTORY — DX: Anemia, unspecified: D64.9

## 2016-10-04 HISTORY — DX: Other specified postprocedural states: R11.2

## 2016-10-04 HISTORY — DX: Malignant (primary) neoplasm, unspecified: C80.1

## 2016-10-04 LAB — CBC WITH DIFFERENTIAL/PLATELET
BASOS ABS: 0 10*3/uL (ref 0.0–0.1)
BASOS PCT: 0 %
EOS ABS: 0.1 10*3/uL (ref 0.0–0.7)
EOS PCT: 2 %
HCT: 39.9 % (ref 36.0–46.0)
Hemoglobin: 13.3 g/dL (ref 12.0–15.0)
LYMPHS PCT: 46 %
Lymphs Abs: 2.7 10*3/uL (ref 0.7–4.0)
MCH: 30.8 pg (ref 26.0–34.0)
MCHC: 33.3 g/dL (ref 30.0–36.0)
MCV: 92.4 fL (ref 78.0–100.0)
MONO ABS: 0.3 10*3/uL (ref 0.1–1.0)
Monocytes Relative: 5 %
Neutro Abs: 2.7 10*3/uL (ref 1.7–7.7)
Neutrophils Relative %: 47 %
PLATELETS: 317 10*3/uL (ref 150–400)
RBC: 4.32 MIL/uL (ref 3.87–5.11)
RDW: 13 % (ref 11.5–15.5)
WBC: 5.9 10*3/uL (ref 4.0–10.5)

## 2016-10-04 LAB — COMPREHENSIVE METABOLIC PANEL
ALBUMIN: 3.7 g/dL (ref 3.5–5.0)
ALT: 15 U/L (ref 14–54)
AST: 27 U/L (ref 15–41)
Alkaline Phosphatase: 44 U/L (ref 38–126)
Anion gap: 8 (ref 5–15)
BUN: 12 mg/dL (ref 6–20)
CHLORIDE: 104 mmol/L (ref 101–111)
CO2: 26 mmol/L (ref 22–32)
CREATININE: 0.97 mg/dL (ref 0.44–1.00)
Calcium: 8.8 mg/dL — ABNORMAL LOW (ref 8.9–10.3)
GFR calc Af Amer: >60 mL/min (ref >60)
GFR calc non Af Amer: >60 mL/min (ref >60)
Glucose, Bld: 104 mg/dL — ABNORMAL HIGH (ref 65–99)
POTASSIUM: 3.3 mmol/L — AB (ref 3.5–5.1)
SODIUM: 138 mmol/L (ref 135–145)
Total Bilirubin: 0.4 mg/dL (ref 0.3–1.2)
Total Protein: 6.6 g/dL (ref 6.5–8.1)

## 2016-10-04 LAB — HCG, SERUM, QUALITATIVE: PREG SERUM: NEGATIVE

## 2016-10-04 NOTE — Pre-Procedure Instructions (Addendum)
Salvatrice Supple  10/04/2016      [facility] - [address], [address] AT [facility] OF [address] [place] [address] Phone: [phone] Fax: [phone]  [place], [place] [address] Phone: [phone] Fax: [phone]    Your procedure is scheduled on 10/06/16  Report to [facility] Admitting at 730 A.M.  Call this number if you have problems the morning of surgery:  [phone]   Remember:  Do not eat food or drink liquids after midnight.  Take these medicines the morning of surgery with A SIP OF WATER    NONE  Except drink carton of breeze drink 2HRS prior to arrival( given to you at preop)   DO NOT DRINK ANY OTHER LIQUIDS  STOP all herbel meds, nsaids (aleve,naproxen,advil,ibuprofen) Starting NOW including aspirin, cal-mag,multi,omega-3 and any other vitamins   Do not wear jewelry, make-up or nail polish.  Do not wear lotions, powders, or perfumes, or deoderant.  Do not shave 48 hours prior to surgery.  Men may shave face and neck.  Do not bring valuables to the hospital.  [facility] is not responsible for any belongings or valuables.  Contacts, dentures or bridgework may not be worn into surgery.  Leave your suitcase in the car.  After surgery it may be brought to your room.  For patients admitted to the hospital, discharge time will be determined by your treatment team.  Patients discharged the day of surgery will not be allowed to drive home.    Special instructions:   Special Instructions: [facility] - Preparing for Surgery  Before surgery, you can play an important role.  Because skin is not sterile, your skin needs to be as free of germs as possible.  You can reduce the number of germs on you skin by washing with CHG (chlorahexidine gluconate) soap before surgery.  CHG is an antiseptic cleaner which kills germs  and bonds with the skin to continue killing germs even after washing.  Please DO NOT use if you have an allergy to CHG or antibacterial soaps.  If your skin becomes reddened/irritated stop using the CHG and inform your nurse when you arrive at Short Stay.  Do not shave (including legs and underarms) for at least 48 hours prior to the first CHG shower.  You may shave your face.  Please follow these instructions carefully:   1.  Shower with CHG Soap the night before surgery and the morning of Surgery.  2.  If you choose to wash your hair, wash your hair first as usual with your normal shampoo.  3.  After you shampoo, rinse your hair and body thoroughly to remove the Shampoo.  4.  Use CHG as you would any other liquid soap.  You can apply chg directly  to the skin and wash gently with scrungie or a clean washcloth.  5.  Apply the CHG Soap to your body ONLY FROM THE NECK DOWN.  Do not use on open wounds or open sores.  Avoid contact with your eyes ears, mouth and genitals (private parts).  Wash genitals (private parts)       with your normal soap.  6.  Wash thoroughly, paying special attention to the area where your surgery will be performed.  7.  Thoroughly rinse your body with warm water from the neck down.  8.  DO NOT shower/wash with your normal soap after using and rinsing off the CHG Soap.  9.  Pat yourself dry with a clean towel.            10.  Wear clean pajamas.            11.  Place clean sheets on your bed the night of your first shower and do not sleep with pets.  Day of Surgery  Do not apply any lotions/deodorants the morning of surgery.  Please wear clean clothes to the hospital/surgery center.  Please read over the  fact sheets that you were given.

## 2016-10-05 ENCOUNTER — Ambulatory Visit
Admission: RE | Admit: 2016-10-05 | Discharge: 2016-10-05 | Disposition: A | Discharge status: Home / Self Care | Payer: Managed Care, Other (non HMO) | Source: Ambulatory Visit | Attending: General Surgery | Admitting: General Surgery

## 2016-10-05 DIAGNOSIS — C50411 Malignant neoplasm of upper-outer quadrant of right female breast: Secondary | ICD-10-CM

## 2016-10-05 DIAGNOSIS — Z170 Estrogen receptor positive status [ER+]: Secondary | ICD-10-CM

## 2016-10-06 ENCOUNTER — Encounter (HOSPITAL_COMMUNITY): Payer: Self-pay | Admitting: *Deleted

## 2016-10-06 ENCOUNTER — Ambulatory Visit
Admission: RE | Admit: 2016-10-06 | Discharge: 2016-10-06 | Disposition: A | Discharge status: Home / Self Care | Payer: Managed Care, Other (non HMO) | Source: Ambulatory Visit | Attending: General Surgery | Admitting: General Surgery

## 2016-10-06 ENCOUNTER — Ambulatory Visit (HOSPITAL_COMMUNITY): Payer: Managed Care, Other (non HMO) | Admitting: Anesthesiology

## 2016-10-06 ENCOUNTER — Encounter (HOSPITAL_COMMUNITY)
Admission: RE | Disposition: A | Discharge status: Home / Self Care | Payer: Self-pay | Source: Ambulatory Visit | Attending: General Surgery

## 2016-10-06 ENCOUNTER — Encounter (HOSPITAL_COMMUNITY)
Admission: RE | Admit: 2016-10-06 | Discharge: 2016-10-06 | Disposition: A | Discharge status: Home / Self Care | Payer: Managed Care, Other (non HMO) | Source: Ambulatory Visit | Attending: General Surgery | Admitting: General Surgery

## 2016-10-06 ENCOUNTER — Ambulatory Visit (HOSPITAL_COMMUNITY): Payer: Managed Care, Other (non HMO)

## 2016-10-06 ENCOUNTER — Ambulatory Visit (HOSPITAL_COMMUNITY)
Admission: RE | Admit: 2016-10-06 | Discharge: 2016-10-06 | Disposition: A | Discharge status: Home / Self Care | Payer: Managed Care, Other (non HMO) | Source: Ambulatory Visit | Attending: General Surgery | Admitting: General Surgery

## 2016-10-06 DIAGNOSIS — Z803 Family history of malignant neoplasm of breast: Secondary | ICD-10-CM | POA: Insufficient documentation

## 2016-10-06 DIAGNOSIS — D649 Anemia, unspecified: Secondary | ICD-10-CM | POA: Insufficient documentation

## 2016-10-06 DIAGNOSIS — C50411 Malignant neoplasm of upper-outer quadrant of right female breast: Secondary | ICD-10-CM

## 2016-10-06 DIAGNOSIS — Z886 Allergy status to analgesic agent status: Secondary | ICD-10-CM | POA: Insufficient documentation

## 2016-10-06 DIAGNOSIS — Z9049 Acquired absence of other specified parts of digestive tract: Secondary | ICD-10-CM | POA: Insufficient documentation

## 2016-10-06 DIAGNOSIS — Z86010 Personal history of colonic polyps: Secondary | ICD-10-CM | POA: Insufficient documentation

## 2016-10-06 DIAGNOSIS — K5190 Ulcerative colitis, unspecified, without complications: Secondary | ICD-10-CM | POA: Insufficient documentation

## 2016-10-06 DIAGNOSIS — Z170 Estrogen receptor positive status [ER+]: Secondary | ICD-10-CM

## 2016-10-06 DIAGNOSIS — Z8049 Family history of malignant neoplasm of other genital organs: Secondary | ICD-10-CM | POA: Insufficient documentation

## 2016-10-06 DIAGNOSIS — Z888 Allergy status to other drugs, medicaments and biological substances status: Secondary | ICD-10-CM | POA: Insufficient documentation

## 2016-10-06 DIAGNOSIS — Z800 Family history of malignant neoplasm of digestive organs: Secondary | ICD-10-CM | POA: Insufficient documentation

## 2016-10-06 DIAGNOSIS — Z885 Allergy status to narcotic agent status: Secondary | ICD-10-CM | POA: Insufficient documentation

## 2016-10-06 DIAGNOSIS — Z8719 Personal history of other diseases of the digestive system: Secondary | ICD-10-CM | POA: Insufficient documentation

## 2016-10-06 HISTORY — PX: BREAST LUMPECTOMY WITH RADIOACTIVE SEED AND SENTINEL LYMPH NODE BIOPSY: SHX6550

## 2016-10-06 HISTORY — DX: Malignant neoplasm of upper-outer quadrant of right female breast: C50.411

## 2016-10-06 HISTORY — PX: BREAST LUMPECTOMY: SHX2

## 2016-10-06 SURGERY — BREAST LUMPECTOMY WITH RADIOACTIVE SEED AND SENTINEL LYMPH NODE BIOPSY
Anesthesia: Regional | Site: Breast | Laterality: Right

## 2016-10-06 MED ORDER — BUPIVACAINE-EPINEPHRINE (PF) 0.5% -1:200000 IJ SOLN
INTRAMUSCULAR | Status: DC | PRN
  Administered 2016-10-06: 30 mL via PERINEURAL

## 2016-10-06 MED ORDER — MIDAZOLAM HCL 5 MG/5ML IJ SOLN
INTRAMUSCULAR | Status: DC | PRN
  Administered 2016-10-06: 2 mg via INTRAVENOUS

## 2016-10-06 MED ORDER — GABAPENTIN 300 MG PO CAPS
ORAL_CAPSULE | ORAL | Status: AC
  Filled 2016-10-06: qty 1

## 2016-10-06 MED ORDER — SODIUM CHLORIDE 0.9 % IJ SOLN
INTRAVENOUS | Status: DC | PRN
  Administered 2016-10-06: 08:00:00 via INTRAMUSCULAR

## 2016-10-06 MED ORDER — DEXAMETHASONE SODIUM PHOSPHATE 10 MG/ML IJ SOLN
INTRAMUSCULAR | Status: DC | PRN
  Administered 2016-10-06: 10 mg via INTRAVENOUS

## 2016-10-06 MED ORDER — CEFAZOLIN SODIUM-DEXTROSE 2-4 GM/100ML-% IV SOLN
INTRAVENOUS | Status: AC
  Filled 2016-10-06: qty 100

## 2016-10-06 MED ORDER — FENTANYL CITRATE (PF) 100 MCG/2ML IJ SOLN
INTRAMUSCULAR | Status: DC | PRN
  Administered 2016-10-06 (×2): 50 ug via INTRAVENOUS
  Administered 2016-10-06: 100 ug via INTRAVENOUS

## 2016-10-06 MED ORDER — SODIUM CHLORIDE 0.9 % IJ SOLN
INTRAMUSCULAR | Status: AC
  Filled 2016-10-06: qty 10

## 2016-10-06 MED ORDER — EPHEDRINE 5 MG/ML INJ
INTRAVENOUS | Status: AC
  Filled 2016-10-06: qty 10

## 2016-10-06 MED ORDER — ONDANSETRON HCL 4 MG/2ML IJ SOLN
INTRAMUSCULAR | Status: AC
  Filled 2016-10-06: qty 2

## 2016-10-06 MED ORDER — ROCURONIUM BROMIDE 10 MG/ML (PF) SYRINGE
PREFILLED_SYRINGE | INTRAVENOUS | Status: AC
  Filled 2016-10-06: qty 10

## 2016-10-06 MED ORDER — CELECOXIB 200 MG PO CAPS
400.0000 mg | ORAL_CAPSULE | ORAL | Status: AC
  Administered 2016-10-06: 400 mg via ORAL

## 2016-10-06 MED ORDER — SUGAMMADEX SODIUM 200 MG/2ML IV SOLN
INTRAVENOUS | Status: AC
  Filled 2016-10-06: qty 2

## 2016-10-06 MED ORDER — GABAPENTIN 300 MG PO CAPS
300.0000 mg | ORAL_CAPSULE | ORAL | Status: AC
  Administered 2016-10-06: 300 mg via ORAL

## 2016-10-06 MED ORDER — METHYLENE BLUE 0.5 % INJ SOLN
INTRAVENOUS | Status: AC
  Filled 2016-10-06: qty 10

## 2016-10-06 MED ORDER — METOCLOPRAMIDE HCL 5 MG/ML IJ SOLN
INTRAMUSCULAR | Status: AC
  Filled 2016-10-06: qty 2

## 2016-10-06 MED ORDER — MIDAZOLAM HCL 2 MG/2ML IJ SOLN
INTRAMUSCULAR | Status: AC
  Filled 2016-10-06: qty 2

## 2016-10-06 MED ORDER — LIDOCAINE HCL (CARDIAC) 20 MG/ML IV SOLN
INTRAVENOUS | Status: DC | PRN
  Administered 2016-10-06: 60 mg via INTRAVENOUS

## 2016-10-06 MED ORDER — SUGAMMADEX SODIUM 200 MG/2ML IV SOLN
INTRAVENOUS | Status: DC | PRN
  Administered 2016-10-06: 150 mg via INTRAVENOUS

## 2016-10-06 MED ORDER — LIDOCAINE 2% (20 MG/ML) 5 ML SYRINGE
INTRAMUSCULAR | Status: AC
  Filled 2016-10-06: qty 5

## 2016-10-06 MED ORDER — HYDROCODONE-ACETAMINOPHEN 7.5-325 MG PO TABS: 1.0000 | ORAL_TABLET | Freq: Once | ORAL | Status: DC | PRN

## 2016-10-06 MED ORDER — ONDANSETRON HCL 4 MG/2ML IJ SOLN
INTRAMUSCULAR | Status: DC | PRN
  Administered 2016-10-06: 4 mg via INTRAVENOUS

## 2016-10-06 MED ORDER — ACETAMINOPHEN 500 MG PO TABS
1000.0000 mg | ORAL_TABLET | ORAL | Status: AC
  Administered 2016-10-06: 1000 mg via ORAL

## 2016-10-06 MED ORDER — CEFAZOLIN SODIUM-DEXTROSE 2-4 GM/100ML-% IV SOLN
2.0000 g | INTRAVENOUS | Status: AC
  Administered 2016-10-06: 2 g via INTRAVENOUS

## 2016-10-06 MED ORDER — PHENYLEPHRINE 40 MCG/ML (10ML) SYRINGE FOR IV PUSH (FOR BLOOD PRESSURE SUPPORT)
PREFILLED_SYRINGE | INTRAVENOUS | Status: AC
  Filled 2016-10-06: qty 10

## 2016-10-06 MED ORDER — LACTATED RINGERS IV SOLN
INTRAVENOUS | Status: DC | PRN
  Administered 2016-10-06 (×2): via INTRAVENOUS

## 2016-10-06 MED ORDER — FENTANYL CITRATE (PF) 100 MCG/2ML IJ SOLN
INTRAMUSCULAR | Status: AC
  Administered 2016-10-06: 25 ug via INTRAVENOUS
  Filled 2016-10-06: qty 2

## 2016-10-06 MED ORDER — DEXAMETHASONE SODIUM PHOSPHATE 10 MG/ML IJ SOLN
INTRAMUSCULAR | Status: AC
  Filled 2016-10-06: qty 1

## 2016-10-06 MED ORDER — CHLORHEXIDINE GLUCONATE CLOTH 2 % EX PADS: 6.0000 | MEDICATED_PAD | Freq: Once | CUTANEOUS | Status: DC

## 2016-10-06 MED ORDER — METOCLOPRAMIDE HCL 5 MG/ML IJ SOLN
INTRAMUSCULAR | Status: DC | PRN
  Administered 2016-10-06: 10 mg via INTRAVENOUS

## 2016-10-06 MED ORDER — BUPIVACAINE HCL 0.25 % IJ SOLN
INTRAMUSCULAR | Status: DC | PRN
  Administered 2016-10-06: 7 mL

## 2016-10-06 MED ORDER — ROCURONIUM BROMIDE 100 MG/10ML IV SOLN
INTRAVENOUS | Status: DC | PRN
  Administered 2016-10-06: 35 mg via INTRAVENOUS

## 2016-10-06 MED ORDER — ARTIFICIAL TEARS OP OINT
TOPICAL_OINTMENT | OPHTHALMIC | Status: DC | PRN
  Administered 2016-10-06: 1 via OPHTHALMIC

## 2016-10-06 MED ORDER — FENTANYL CITRATE (PF) 100 MCG/2ML IJ SOLN
25.0000 ug | INTRAMUSCULAR | Status: DC | PRN
  Administered 2016-10-06: 25 ug via INTRAVENOUS

## 2016-10-06 MED ORDER — SCOPOLAMINE 1 MG/3DAYS TD PT72
1.0000 | MEDICATED_PATCH | TRANSDERMAL | Status: DC
  Administered 2016-10-06: 1.5 mg via TRANSDERMAL
  Filled 2016-10-06: qty 1

## 2016-10-06 MED ORDER — HYDROCODONE-ACETAMINOPHEN 5-325 MG PO TABS: 1.0000 | ORAL_TABLET | Freq: Four times a day (QID) | ORAL | 0 refills | Status: DC | PRN

## 2016-10-06 MED ORDER — PROPOFOL 10 MG/ML IV BOLUS
INTRAVENOUS | Status: DC | PRN
  Administered 2016-10-06: 160 mg via INTRAVENOUS

## 2016-10-06 MED ORDER — MEPERIDINE HCL 25 MG/ML IJ SOLN: 6.2500 mg | INTRAMUSCULAR | Status: DC | PRN

## 2016-10-06 MED ORDER — DEXTROSE 5 % IV SOLN
INTRAVENOUS | Status: DC | PRN
  Administered 2016-10-06: 08:00:00 via INTRAVENOUS

## 2016-10-06 MED ORDER — TECHNETIUM TC 99M SULFUR COLLOID FILTERED
1.0000 | Freq: Once | INTRAVENOUS | Status: AC | PRN
  Administered 2016-10-06: 1 via INTRADERMAL

## 2016-10-06 MED ORDER — CELECOXIB 200 MG PO CAPS
ORAL_CAPSULE | ORAL | Status: AC
  Filled 2016-10-06: qty 2

## 2016-10-06 MED ORDER — FENTANYL CITRATE (PF) 100 MCG/2ML IJ SOLN
INTRAMUSCULAR | Status: AC
  Filled 2016-10-06: qty 2

## 2016-10-06 MED ORDER — ACETAMINOPHEN 500 MG PO TABS
ORAL_TABLET | ORAL | Status: AC
  Filled 2016-10-06: qty 2

## 2016-10-06 MED ORDER — EPHEDRINE SULFATE 50 MG/ML IJ SOLN
INTRAMUSCULAR | Status: DC | PRN
  Administered 2016-10-06 (×2): 5 mg via INTRAVENOUS

## 2016-10-06 MED ORDER — PROMETHAZINE HCL 25 MG/ML IJ SOLN: 6.2500 mg | INTRAMUSCULAR | Status: DC | PRN

## 2016-10-06 MED ORDER — PROPOFOL 10 MG/ML IV BOLUS
INTRAVENOUS | Status: AC
  Filled 2016-10-06: qty 20

## 2016-10-06 MED ORDER — ARTIFICIAL TEARS OP OINT
TOPICAL_OINTMENT | OPHTHALMIC | Status: AC
  Filled 2016-10-06: qty 7

## 2016-10-06 MED ORDER — 0.9 % SODIUM CHLORIDE (POUR BTL) OPTIME
TOPICAL | Status: DC | PRN
  Administered 2016-10-06: 1000 mL

## 2016-10-06 MED ORDER — BUPIVACAINE HCL (PF) 0.25 % IJ SOLN
INTRAMUSCULAR | Status: AC
  Filled 2016-10-06: qty 30

## 2016-10-06 SURGICAL SUPPLY — 55 items
APPLIER CLIP 9.375 MED OPEN (MISCELLANEOUS) ×2
BINDER BREAST LRG (GAUZE/BANDAGES/DRESSINGS) ×2 IMPLANT
BINDER BREAST XLRG (GAUZE/BANDAGES/DRESSINGS) IMPLANT
BLADE SURG 15 STRL LF DISP TIS (BLADE) ×1 IMPLANT
BLADE SURG 15 STRL SS (BLADE) ×1
CANISTER SUCTION 2500CC (MISCELLANEOUS) ×2 IMPLANT
CHLORAPREP W/TINT 26ML (MISCELLANEOUS) IMPLANT
CLIP APPLIE 9.375 MED OPEN (MISCELLANEOUS) ×1 IMPLANT
CONT SPEC 4OZ CLIKSEAL STRL BL (MISCELLANEOUS) ×6 IMPLANT
COVER PROBE W GEL 5X96 (DRAPES) ×2 IMPLANT
COVER SURGICAL LIGHT HANDLE (MISCELLANEOUS) ×2 IMPLANT
DERMABOND ADVANCED (GAUZE/BANDAGES/DRESSINGS) ×1
DERMABOND ADVANCED .7 DNX12 (GAUZE/BANDAGES/DRESSINGS) ×1 IMPLANT
DEVICE DUBIN SPECIMEN MAMMOGRA (MISCELLANEOUS) ×2 IMPLANT
DRAPE CHEST BREAST 15X10 FENES (DRAPES) ×2 IMPLANT
DRAPE PROXIMA HALF (DRAPES) ×2 IMPLANT
DRAPE UTILITY XL STRL (DRAPES) ×2 IMPLANT
DRSG PAD ABDOMINAL 8X10 ST (GAUZE/BANDAGES/DRESSINGS) ×2 IMPLANT
ELECT CAUTERY BLADE 6.4 (BLADE) ×2 IMPLANT
ELECT REM PT RETURN 9FT ADLT (ELECTROSURGICAL) ×2
ELECTRODE REM PT RTRN 9FT ADLT (ELECTROSURGICAL) ×1 IMPLANT
GAUZE SPONGE 4X4 12PLY STRL (GAUZE/BANDAGES/DRESSINGS) ×2 IMPLANT
GLOVE BIO SURGEON STRL SZ7 (GLOVE) ×2 IMPLANT
GLOVE BIOGEL PI IND STRL 7.0 (GLOVE) ×2 IMPLANT
GLOVE BIOGEL PI INDICATOR 7.0 (GLOVE) ×2
GLOVE EUDERMIC 7 POWDERFREE (GLOVE) ×2 IMPLANT
GOWN STRL REUS W/ TWL LRG LVL3 (GOWN DISPOSABLE) ×1 IMPLANT
GOWN STRL REUS W/ TWL XL LVL3 (GOWN DISPOSABLE) ×1 IMPLANT
GOWN STRL REUS W/TWL LRG LVL3 (GOWN DISPOSABLE) ×1
GOWN STRL REUS W/TWL XL LVL3 (GOWN DISPOSABLE) ×1
ILLUMINATOR WAVEGUIDE N/F (MISCELLANEOUS) IMPLANT
KIT BASIN OR (CUSTOM PROCEDURE TRAY) ×2 IMPLANT
KIT MARKER MARGIN INK (KITS) ×2 IMPLANT
LIGHT WAVEGUIDE WIDE FLAT (MISCELLANEOUS) ×2 IMPLANT
NDL SAFETY ECLIPSE 18X1.5 (NEEDLE) IMPLANT
NEEDLE FILTER BLUNT 18X 1/2SAF (NEEDLE) ×1
NEEDLE FILTER BLUNT 18X1 1/2 (NEEDLE) ×1 IMPLANT
NEEDLE HYPO 18GX1.5 SHARP (NEEDLE)
NEEDLE HYPO 25GX1X1/2 BEV (NEEDLE) ×4 IMPLANT
NEEDLE HYPO 25X1 1.5 SAFETY (NEEDLE) ×2 IMPLANT
NS IRRIG 1000ML POUR BTL (IV SOLUTION) ×2 IMPLANT
PACK SURGICAL SETUP 50X90 (CUSTOM PROCEDURE TRAY) ×2 IMPLANT
PAD ABD 8X10 STRL (GAUZE/BANDAGES/DRESSINGS) ×2 IMPLANT
PENCIL BUTTON HOLSTER BLD 10FT (ELECTRODE) ×2 IMPLANT
SPONGE LAP 18X18 X RAY DECT (DISPOSABLE) ×2 IMPLANT
SPONGE LAP 4X18 X RAY DECT (DISPOSABLE) ×6 IMPLANT
SUT MNCRL AB 4-0 PS2 18 (SUTURE) ×4 IMPLANT
SUT SILK 2 0 SH (SUTURE) IMPLANT
SUT VIC AB 3-0 SH 18 (SUTURE) ×2 IMPLANT
SYR BULB 3OZ (MISCELLANEOUS) ×2 IMPLANT
SYR CONTROL 10ML LL (SYRINGE) ×2 IMPLANT
TOWEL OR 17X24 6PK STRL BLUE (TOWEL DISPOSABLE) ×2 IMPLANT
TOWEL OR 17X26 10 PK STRL BLUE (TOWEL DISPOSABLE) ×2 IMPLANT
TUBE CONNECTING 12X1/4 (SUCTIONS) ×2 IMPLANT
YANKAUER SUCT BULB TIP NO VENT (SUCTIONS) ×2 IMPLANT

## 2016-10-06 NOTE — Transfer of Care (Signed)
Immediate Anesthesia Transfer of Care Note  Patient: Gina Dickson  Procedure(s) Performed: Procedure(s): BREAST LUMPECTOMY WITH RADIOACTIVE SEED AND SENTINEL LYMPH NODE BIOPSY (Right)  Patient Location: PACU  Anesthesia Type:General and GA combined with regional for post-op pain  Level of Consciousness: awake, oriented, sedated, patient cooperative and responds to stimulation  Airway & Oxygen Therapy: Patient Spontanous Breathing and Patient connected to nasal cannula oxygen  Post-op Assessment: Report given to RN, Post -op Vital signs reviewed and stable, Patient moving all extremities and Patient moving all extremities X 4  Post vital signs: Reviewed and stable  Last Vitals:  Vitals:   10/06/16 0613  BP: 112/67  Pulse: 75  Resp: 16  Temp: 36.8 C    Last Pain:  Vitals:   10/06/16 0613  TempSrc: Oral      Patients Stated Pain Goal: 5 ([id number])  Complications: No apparent anesthesia complications

## 2016-10-06 NOTE — Anesthesia Procedure Notes (Signed)
Procedure Name: Intubation Date/Time: 10/06/2016 7:52 AM Performed by: Jacquiline Doe A Pre-anesthesia Checklist: Patient identified, Emergency Drugs available, Suction available and Patient being monitored Patient Re-evaluated:Patient Re-evaluated prior to inductionOxygen Delivery Method: Circle System Utilized and Circle system utilized Preoxygenation: Pre-oxygenation with 100% oxygen Intubation Type: IV induction and Cricoid Pressure applied Ventilation: Mask ventilation without difficulty Laryngoscope Size: Mac and 4 Grade View: Grade I Tube type: Oral Tube size: 7.5 mm Number of attempts: 1 Airway Equipment and Method: Stylet and Oral airway Placement Confirmation: ETT inserted through vocal cords under direct vision,  positive ETCO2 and breath sounds checked- equal and bilateral Secured at: 22 cm Tube secured with: Tape Dental Injury: Teeth and Oropharynx as per pre-operative assessment

## 2016-10-06 NOTE — Discharge Instructions (Signed)
[facility] °Office Phone Number [phone] ° °BREAST BIOPSY/ PARTIAL MASTECTOMY: POST OP INSTRUCTIONS ° °Always review your discharge instruction sheet given to you by the facility where your surgery was performed. ° °IF YOU HAVE DISABILITY OR FAMILY LEAVE FORMS, YOU MUST BRING THEM TO THE OFFICE FOR PROCESSING.  DO NOT GIVE THEM TO YOUR DOCTOR. ° °1. A prescription for pain medication may be given to you upon discharge.  Take your pain medication as prescribed, if needed.  If narcotic pain medicine is not needed, then you may take acetaminophen (Tylenol) or ibuprofen (Advil) as needed. °2. Take your usually prescribed medications unless otherwise directed °3. If you need a refill on your pain medication, please contact your pharmacy.  They will contact our office to request authorization.  Prescriptions will not be filled after 5pm or on week-ends. °4. You should eat very light the first 24 hours after surgery, such as soup, crackers, pudding, etc.  Resume your normal diet the day after surgery. °5. Most patients will experience some swelling and bruising in the breast.  Ice packs and a good support bra will help.  Swelling and bruising can take several days to resolve.  °6. It is common to experience some constipation if taking pain medication after surgery.  Increasing fluid intake and taking a stool softener will usually help or prevent this problem from occurring.  A mild laxative (Milk of Magnesia or Miralax) should be taken according to package directions if there are no bowel movements after 48 hours. °7. Unless discharge instructions indicate otherwise, you may remove your bandages 24-48 hours after surgery, and you may shower at that time.  You may have steri-strips (small skin tapes) in place directly over the incision.  These strips should be left on the skin for 7-10 days.  If your surgeon used skin glue on the incision, you may shower in 24 hours.  The glue will flake off over the  next 2-3 weeks.  Any sutures or staples will be removed at the office during your follow-up visit. °8. ACTIVITIES:  You may resume regular daily activities (gradually increasing) beginning the next day.  Wearing a good support bra or sports bra minimizes pain and swelling.  You may have sexual intercourse when it is comfortable. °a. You may drive when you no longer are taking prescription pain medication, you can comfortably wear a seatbelt, and you can safely maneuver your car and apply brakes. °b. RETURN TO WORK:  ______________________________________________________________________________________ °9. You should see your doctor in the office for a follow-up appointment approximately two weeks after your surgery.  Your doctor’s nurse will typically make your follow-up appointment when she calls you with your pathology report.  Expect your pathology report 2-3 business days after your surgery.  You may call to check if you do not hear from us after three days. °10. OTHER INSTRUCTIONS: _______________________________________________________________________________________________ _____________________________________________________________________________________________________________________________________ °_____________________________________________________________________________________________________________________________________ °_____________________________________________________________________________________________________________________________________ ° °WHEN TO CALL YOUR DOCTOR: °1. Fever over 101.0 °2. Nausea and/or vomiting. °3. Extreme swelling or bruising. °4. Continued bleeding from incision. °5. Increased pain, redness, or drainage from the incision. ° °The clinic staff is available to answer your questions during regular business hours.  Please don’t hesitate to call and ask to speak to one of the nurses for clinical concerns.  If you have a medical emergency, go to the nearest  emergency room or call 911.  A surgeon from [facility] is always on call at the hospital. ° °For further questions, please visit [email or web address]  °

## 2016-10-06 NOTE — Anesthesia Preprocedure Evaluation (Addendum)
Anesthesia Evaluation  Patient identified by MRN, date of birth, ID band Patient awake    Reviewed: Allergy & Precautions, NPO status , Patient's Chart, lab work & pertinent test results  History of Anesthesia Complications (+) PONV and history of anesthetic complications  Airway Mallampati: I       Dental no notable dental hx. (+) Teeth Intact   Pulmonary neg pulmonary ROS,    Pulmonary exam normal breath sounds clear to auscultation       Cardiovascular negative cardio ROS Normal cardiovascular exam Rhythm:Regular Rate:Normal     Neuro/Psych negative neurological ROS  negative psych ROS   GI/Hepatic Neg liver ROS,   Endo/Other  Breast Ca  Renal/GU negative Renal ROS  negative genitourinary   Musculoskeletal   Abdominal Normal abdominal exam  (+)   Peds  Hematology  (+) anemia ,   Anesthesia Other Findings   Reproductive/Obstetrics                            Anesthesia Physical Anesthesia Plan  ASA: II  Anesthesia Plan: General and Regional   Post-op Pain Management:  Regional for Post-op pain   Induction: Intravenous  Airway Management Planned: LMA  Additional Equipment:   Intra-op Plan:   Post-operative Plan:   Informed Consent: I have reviewed the patients History and Physical, chart, labs and discussed the procedure including the risks, benefits and alternatives for the proposed anesthesia with the patient or authorized representative who has indicated his/her understanding and acceptance.   Dental advisory given  Plan Discussed with: Anesthesiologist, CRNA and Surgeon  Anesthesia Plan Comments:         Anesthesia Quick Evaluation

## 2016-10-06 NOTE — Progress Notes (Signed)
Report given to david res rn as caregiver

## 2016-10-06 NOTE — Op Note (Signed)
Patient Name:           Gina Dickson   Date of Surgery:        10/06/2016  Pre op Diagnosis:      Invasive cancer right breast, upper outer quadrant, estrogen receptor positive, clinical  stage TIb N0  Post op Diagnosis:    Same  Procedure:                 Inject blue dye right breast, right breast lumpectomy with radioactive seed localization, right axillary deep sentinel lymph node biopsy   Surgeon:                     Edsel Petrin. Dalbert Batman, M.D., FACS  Assistant:                      OR staff   Indication for Assistant: N/A  Operative Indications:   This is a very pleasant 50 year old Caucasian female, referred by Dr. Leonides Sake at the [facility] for evaluation of a newly diagnosed cancer in the upper outer quadrant of the right breast. Dr. [name] added Christine is her PCP. This morning she saw Dr. Verdell Carmine, [facility]  oncology specialists in [place]. I have her note.       The patient has had bilateral reduction mammoplasties in the past 2005,but no pathologic breast disease. She gets annual mammograms. Recent screening mammogram and ultrasound on the right showed category C density, 7 mm mass seen at the 10 o'clock position 3 cm from the nipple. Axilla was negative. 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 She has done well since the biopsy. She was presented in our city wide multidisciplinary tumor board this past Wednesday, yesterday. She takes estrogens and has been told to discontinue that by me and by Dr. Georgiann Cocker.       Comorbidities include bilateral reduction mammoplasty 2005. Total colectomy with J-pouch for ulcerative colitis in [facility] and has done well. C-section through Pfannenstiel incision. ACL repair.      Family history reveals a maternal aunt had breast cancer at age 10. Sister had cervical cancer 30. Father had multiple myeloma a 62. Paternal  grandfather had gastric cancer. Dr. Georgiann Cocker is going to have this reviewed by her genetic counselor.  Social history reveals she is separated. Has 3 children. Lives in [place]. 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 Denies tobacco. Alcohol rarely.       We did talk about the options for surgical intervention. She knows that surgery is the next step in her care. We talked about the difference between mastectomy with or without reconstruction, lumpectomy, and sentinel node biopsy. She is motivated for breast conservation and I think she is an excellent candidate for that.       She'll be scheduled for right breast lumpectomy with radioactive seed localization, right axillary sentinel lymph node biopsy. It is possible that we may be able to do this through a circumareolar incision which would be nice cosmetically. I told her I was not sure. I discussed the indications, details, techniques, and numerous risk of the surgery with her. She is aware of the risk of bleeding, infection, cosmetic deformity, nerve damage and chronic pain, reoperation for positive margins or multiple positive nodes. She has that she will probably be offered a whole breast radiation therapy and  antiestrogen therapy. Oncotype testing can be considered if the tumor is greater than 1 cm. She understands all of these issues. All of her questions were answered. She agrees with this plan.  Operative Findings:       Technetium nuclear medicine injection was performed by the nuclear medicine technician in the holding area.  I was able to hear the radioactive seed with the neoprobe in the holding area.  She underwent a right pectoral block by the anesthesiologist.  In the operating room I was able to perform the lumpectomy through a circumareolar incision at the areolar margin, through a previous scar from reduction mammoplasty.  The specimen mammogram looked good with the marker clip in the  seed well centered in the specimen.  I found 3 sentinel lymph nodes that did not appear enlarged.  Procedure in Detail:          Following the induction of general endotracheal anesthesia a surgical timeout was performed.  Intravenous antibiotics were given.  Following a Betadine prep I injected 5 mL of dilute methylene blue into the right breast, some subareolar and some upper outer quadrant.  The breast was massaged for a few minutes.  The right breast and axilla and chest wall were then prepped and draped in a sterile fashion.  0.5% Marcaine with epinephrine was used as infiltration anesthetic into the skin and subcutaneous cutaneous tissue.      Using the neoprobe I found that the I-125 signal was deep but reasonably centered in the breast at about the 10:00 position.  Circumareolar incision was made at the areolar margin through the old scar.  Dissection was carried down into the breast using the lighted retractors and the lumpectomy was performed.  The specimen was marked with silk sutures and a 6 color ink kit to orient the pathologist.  Specimen mammogram looked good.  The specimen was sent to the lab.  The wound was irrigated with saline.  Hemostasis was excellent and achieved with  electrocautery.  5 metal clips were placed in the lumpectomy cavity at the cardinal positions to indicate the lumpectomy cavity.  I closed the breast tissue in 2 layers with interrupted 3-0 Vicryl.  I closed the skin with some deep subcuticular interrupted 3-0 Vicryl and a running subcuticular 4-0 Monocryl.     I then made a transverse incision at the hairline in the right axilla through a skin crease.  Dissection was carried down through the subcutaneous tissue.  The clavipectoral fascia was incised and we entered the deep axillary space.  Using the neoprobe I found 3 sentinel lymph nodes and these were sent separately.  After this was done there was no more radioactivity.  Hemostasis was excellent.  The wound was  irrigated.  The axillary wound was closed in 2 separate layers with interrupted 3-0 Vicryl and the skin closed with a running subcutaneous taken a 4-0 Monocryl.  Clean bandages and a breast binder were placed.  The patient tolerated the procedure well was taken to PACU in stable condition.  EBL 25 mL.  Counts correct.  Complications none.    Edsel Petrin. Dalbert Batman, M.D., FACS General and Minimally Invasive Surgery Breast and Colorectal Surgery  10/06/2016 9:33 AM

## 2016-10-06 NOTE — Anesthesia Procedure Notes (Addendum)
Anesthesia Regional Block:  Pectoralis block  Pre-Anesthetic Checklist: ,, timeout performed, Correct Patient, Correct Site, Correct Laterality, Correct Procedure, Correct Position, site marked, Risks and benefits discussed,  Surgical consent,  Pre-op evaluation,  At surgeon's request and post-op pain management  Laterality: Right  Prep: chloraprep       Needles:  Injection technique: Single-shot  Needle Type: Echogenic Stimulator Needle     Needle Length: 9cm 9 cm Needle Gauge: 21 and 21 G  Needle insertion depth: 5 cm   Additional Needles:  Procedures: ultrasound guided (picture in chart) Pectoralis block Narrative:  Start time: 10/06/2016 7:20 AM End time: 10/06/2016 7:26 AM Injection made incrementally with aspirations every 5 mL.  Performed by: Personally  Anesthesiologist: Josephine Igo  Additional Notes: Relevant anatomy ID'd. Incremental 48ml injetion with frequent aspiration. Patient tolerated procedure well.

## 2016-10-06 NOTE — Anesthesia Postprocedure Evaluation (Signed)
Anesthesia Post Note  Patient: Gina Dickson  Procedure(s) Performed: Procedure(s) (LRB): BREAST LUMPECTOMY WITH RADIOACTIVE SEED AND SENTINEL LYMPH NODE BIOPSY (Right)  Patient location during evaluation: PACU Anesthesia Type: General and Regional Level of consciousness: awake and alert and oriented Pain management: pain level controlled Vital Signs Assessment: post-procedure vital signs reviewed and stable Respiratory status: spontaneous breathing, nonlabored ventilation and respiratory function stable Cardiovascular status: blood pressure returned to baseline and stable Postop Assessment: no signs of nausea or vomiting Anesthetic complications: no    Last Vitals:  Vitals:   10/06/16 1000 10/06/16 1015  BP: 136/64 108/73  Pulse: (!) 102 97  Resp: 12 17  Temp:      Last Pain:  Vitals:   10/06/16 1015  TempSrc:   PainSc: 0-No pain                 Amna Welker A.

## 2016-10-07 ENCOUNTER — Encounter (HOSPITAL_COMMUNITY): Payer: Self-pay | Admitting: General Surgery

## 2016-10-12 NOTE — Progress Notes (Signed)
Inform patient of Pathology report,.patient is aware of her pathology report.  I called her this morning.  hmi

## 2016-10-28 ENCOUNTER — Encounter: Payer: Self-pay | Admitting: Radiation Oncology

## 2016-10-29 ENCOUNTER — Telehealth: Payer: Self-pay | Admitting: General Surgery

## 2016-10-29 NOTE — Telephone Encounter (Signed)
She underwent right breast lumpectomy and right axillary sentinel lymph node biopsy by Dr. Dalbert Batman 10/06/2016. When she saw him in the office last week, she was having some pain in her arm and he thought it may be neuropathic in nature. She awoke last night with some severe shooting pain down that arm. She also has some aches. She says there is no redness surrounding the incisions and she has no fever. I told her it sounds like neuropathic pain to me. I recommended that she try using some heat as well as mixing Tylenol and Advil. I recommended she do light activities with the arm. If the pain is no better by Monday, I recommended she call the office and talk with Dr. Darrel Hoover assistant.

## 2016-11-01 ENCOUNTER — Ambulatory Visit: Payer: Managed Care, Other (non HMO)

## 2016-11-01 ENCOUNTER — Ambulatory Visit
Admission: RE | Admit: 2016-11-01 | Discharge: 2016-11-01 | Disposition: A | Discharge status: Home / Self Care | Payer: Managed Care, Other (non HMO) | Source: Ambulatory Visit | Attending: Radiation Oncology | Admitting: Radiation Oncology

## 2016-11-01 DIAGNOSIS — Z170 Estrogen receptor positive status [ER+]: Secondary | ICD-10-CM | POA: Insufficient documentation

## 2016-11-01 DIAGNOSIS — C50411 Malignant neoplasm of upper-outer quadrant of right female breast: Secondary | ICD-10-CM | POA: Insufficient documentation

## 2016-11-01 DIAGNOSIS — Z9889 Other specified postprocedural states: Secondary | ICD-10-CM | POA: Insufficient documentation

## 2016-11-01 DIAGNOSIS — Z79899 Other long term (current) drug therapy: Secondary | ICD-10-CM | POA: Insufficient documentation

## 2016-11-02 DIAGNOSIS — I890 Lymphedema, not elsewhere classified: Secondary | ICD-10-CM | POA: Insufficient documentation

## 2016-11-02 NOTE — Progress Notes (Signed)
error 

## 2016-11-07 ENCOUNTER — Telehealth: Payer: Self-pay

## 2016-11-07 ENCOUNTER — Ambulatory Visit: Admission: RE | Admit: 2016-11-07 | Payer: Managed Care, Other (non HMO) | Source: Ambulatory Visit

## 2016-11-07 ENCOUNTER — Ambulatory Visit
Admission: RE | Admit: 2016-11-07 | Discharge: 2016-11-07 | Disposition: A | Discharge status: Home / Self Care | Payer: Managed Care, Other (non HMO) | Source: Ambulatory Visit | Attending: Radiation Oncology | Admitting: Radiation Oncology

## 2016-11-07 DIAGNOSIS — C50411 Malignant neoplasm of upper-outer quadrant of right female breast: Secondary | ICD-10-CM

## 2016-11-07 NOTE — Telephone Encounter (Signed)
I called and spoke to Gina Dickson about her scheduled appointment this morning. She states that she had tried to call multiple times last week and left voice mails wanting to reschedule her appointment today. She reports she has Lymphedema and needs to heal before she can begin radiation. I will inform the schedulers of the above, and have her appointment rescheduled.

## 2016-11-09 NOTE — Progress Notes (Signed)
Location of Breast Cancer: Right Breast  Histology per Pathology Report:  09/22/16 Diagnosis Breast, right, needle core biopsy, 10 o'clock 3cmfn INVASIVE DUCTAL CARCINOMA, GRADE 3 DUCTAL CARCINOMA IN SITU  Receptor Status: ER(95%), PR (10%)  10/06/16 Diagnosis 1. Breast, lumpectomy, Right - MULTIFOCAL INVASIVE DUCTAL CARCINOMA. - LARGER INVASIVE FOCUS MEASURES 1 CM IN GREATEST DIMENSION AND IS GRADE 3. - SMALLER NEARBY SATELLITE INVASIVE FOCUS MEASURES UP TO 0.5 CM IN GREATEST DIMENSION AND IS GRADE 2. - INTERMEDIATE GRADE DUCTAL CARCINOMA IN SITU. - MARGINS ARE NEGATIVE. - SEE ONCOLOGY TEMPLATE AND COMMENT. 2. Lymph node, sentinel, biopsy, Right axillary #1 - ONE BENIGN LYMPH NODE WITH NO TUMOR SEEN (0/1). 3. Lymph node, sentinel, biopsy, Right axillary #2 - ONE BENIGN LYMPH NODE WITH NO TUMOR SEEN (0/1). 4. Lymph node, sentinel, biopsy, Right axillary #3 - ONE BENIGN LYMPH NODE WITH NO TUMOR SEEN (0/1). 5. Lymph node, sentinel, biopsy, Right axillary #4 - ONE BENIGN LYMPH NODE WITH NO TUMOR SEEN (0/1).  Did patient present with symptoms or was this found on screening mammography?: It was found on a screening mammogram.   Past/Anticipated interventions by surgeon, if any: 10/06/16 Procedure:                 Inject blue dye right breast, right breast lumpectomy with radioactive seed localization, right axillary deep sentinel lymph node biopsy  Surgeon:                     Edsel Petrin. Dalbert Batman, M.D., [facility]   Past/Anticipated interventions by medical oncology, if any: She has seen Verdell Carmine MD who is an oncologist at [facility]. She prescribed Tamoxifen which she is currently taking.  Dr. Georgiann Cocker has sent her tumor for testing regarding possible chemotherapy.  She has paper work with her today from Dr. Georgiann Cocker office.    Lymphedema issues, if any:  She is seeing PT for ? Lymphedema. She is currently wearing a compression sleeve. She will wear it off and on  at home.   Pain issues, if any:  She denies, she does report discomfort from swelling in her Right arm.   SAFETY ISSUES:  Prior radiation? No  Pacemaker/ICD? No  Possible current pregnancy? 10/04/16 serum pregnancy negative. She is not currently sexually active.   Is the patient on methotrexate? No  Current Complaints / other details:   The patient has had bilateral reduction mammoplasties in the past 2005,but no pathologic breast disease.  BP 111/77   Pulse 77   Temp 98.5 F (36.9 C)   Ht 5\' 5"  (1.651 m)   Wt 169 lb 9.6 oz (76.9 kg)   SpO2 100% Comment: room air  BMI 28.22 kg/m     Kirstin Kugler, Stephani Police, RN 11/09/2016,4:12 PM

## 2016-11-11 ENCOUNTER — Encounter (HOSPITAL_COMMUNITY): Payer: Self-pay

## 2016-11-16 ENCOUNTER — Ambulatory Visit
Admission: RE | Admit: 2016-11-16 | Discharge: 2016-11-16 | Disposition: A | Discharge status: Home / Self Care | Payer: Managed Care, Other (non HMO) | Source: Ambulatory Visit | Attending: Radiation Oncology | Admitting: Radiation Oncology

## 2016-11-16 ENCOUNTER — Encounter: Payer: Self-pay | Admitting: Radiation Oncology

## 2016-11-16 DIAGNOSIS — Z79899 Other long term (current) drug therapy: Secondary | ICD-10-CM | POA: Diagnosis not present

## 2016-11-16 DIAGNOSIS — C50411 Malignant neoplasm of upper-outer quadrant of right female breast: Secondary | ICD-10-CM

## 2016-11-16 DIAGNOSIS — Z9889 Other specified postprocedural states: Secondary | ICD-10-CM | POA: Diagnosis not present

## 2016-11-16 DIAGNOSIS — Z170 Estrogen receptor positive status [ER+]: Secondary | ICD-10-CM | POA: Diagnosis not present

## 2016-11-16 NOTE — Progress Notes (Signed)
Radiation Oncology         [phone] ________________________________  Initial Outpatient Consultation  Name: Gina Dickson MRN: [id number]  Date: 11/16/2016  DOB: 06/07/1966  CC:James Kim, MD  Ingram, Haywood, MD   REFERRING PHYSICIAN: Ingram, Haywood, MD  DIAGNOSIS:    ICD-9-CM ICD-10-CM   1. 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 female who presented with abnormal 3D screening mammogram followed by a confirmative diagnotic right mammogram and ultrasound. Subsequent biopsy showed invasive ductal carcinoma with associated DCIS with characteristics as described above in the diagnosis. The patient underwent lumpectomy, and 2 foci of invasive tumor were removed, measuring 1.0 and 0.5 cm in greatest dimension. Margins were negative by at least 0.5 cm to invasive and in situ disease. She has an oncotype score of 37 which is high risk. Her medical oncologist, Dr. Hopkins of [facility], has started her on Tamoxifen. Dr. Hopkins saw her on 11/02/16. Dr. Hopkins wrote in her note that she would order oncotype Dx testing and she her for f/u soon after. The patient was referred to physical therapy for lymphedema. She sees Dr Hopkins this week.  On review of systems, the patient denies pain. She is currently seeing PT for lymphedema in her right arm, and wears a compression sleeve occasionally. Serum test on 10/04/16 was negative for pregnancy, and the patient reports she is not currently sexually active. The patient is accompanied by her mother in the clinic today.  Of note, the patient's first menstrual period was at age 16. She is pre-menopausal. The patient has used  hormonal birth control off and on for over 21 years. The patient had bilateral mammoplasties in 2005.  PREVIOUS RADIATION THERAPY: No  PAST MEDICAL HISTORY:  has a past medical history of Anemia; Anxiety; Cancer (HCC); Colitis; Complication of anesthesia; PONV (postoperative nausea and vomiting); Primary cancer of upper outer quadrant of right female breast (HCC) (10/06/2016); PTSD (post-traumatic stress disorder); and Ulcerative colitis (HCC).    PAST SURGICAL HISTORY: Past Surgical History:  Procedure Laterality Date  . ANTERIOR CRUCIATE LIGAMENT REPAIR  2007   right  . BREAST LUMPECTOMY WITH RADIOACTIVE SEED AND SENTINEL LYMPH NODE BIOPSY Right 10/06/2016   Procedure: BREAST LUMPECTOMY WITH RADIOACTIVE SEED AND SENTINEL LYMPH NODE BIOPSY;  Surgeon: Haywood Ingram, MD;  Location: [facility] OR;  Service: General;  Laterality: Right;  . BREAST REDUCTION SURGERY Bilateral 2004  . CESAREAN SECTION    . COLONOSCOPY    . KNEE ARTHROSCOPY WITH MEDIAL MENISECTOMY Right 06/09/2014   Procedure: KNEE ARTHROSCOPY WITH PARTIAL MEDIAL MENISECTOMY, CHONDROPLASTY;  Surgeon: Peter G Dalldorf, MD;  Location: [facility];  Service: Orthopedics;  Laterality: Right;  . PARTIAL COLECTOMY  2000   ulcerative  . WISDOM TOOTH EXTRACTION      FAMILY HISTORY: family history is not on file.  SOCIAL HISTORY:  reports that she has never smoked. She has never used smokeless tobacco. She reports that she drinks about 0.6 oz of alcohol per week . She reports that she does not use drugs. 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  ALLERGIES: Adhesive [tape]; Alcohol [isopropyl alcohol]; Erythromycin; Morphine and related; and Other  MEDICATIONS:    Current Outpatient Prescriptions  Medication Sig Dispense Refill  . CALCIUM-MAGNESIUM PO Take 1 tablet by mouth daily.    . cetirizine (ZYRTEC) 10 MG tablet Take 10 mg by mouth every evening.    . Multiple Vitamins-Minerals (MULTIVITAMIN WITH MINERALS) tablet Take  1 tablet by mouth daily.    . Omega-3 Fatty Acids (FISH OIL PO) Take 1 capsule by mouth daily.    . Polyvinyl Alcohol-Povidone (REFRESH OP) Apply 1 drop to eye 2 (two) times daily.    . tamoxifen (NOLVADEX) 20 MG tablet Take 20 mg by mouth at bedtime.     Marland Kitchen HYDROcodone-acetaminophen (NORCO) 5-325 MG tablet Take 1-2 tablets by mouth every 6 (six) hours as needed for moderate pain or severe pain. (Patient not taking: Reported on 11/17/2016) 30 tablet 0  . ibuprofen (ADVIL,MOTRIN) 200 MG tablet Take 200-400 mg by mouth 2 (two) times daily as needed for headache or moderate pain.      No current facility-administered medications for this encounter.     REVIEW OF SYSTEMS: as above  PHYSICAL EXAM:  height is 5' 5" (1.651 m) and weight is 169 lb 9.6 oz (76.9 kg). Her temperature is 98.5 F (36.9 C). Her blood pressure is 111/77 and her pulse is 77. Her oxygen saturation is 100%.   General: Alert and oriented, in no acute distress HEENT: Extraocular movements are intact. Oropharynx and oral cavity are clear. Neck: Neck is supple, no palpable cervical or supraclavicular lymphadenopathy. Heart: Regular in rate and rhythm with no murmurs. Chest: Clear to auscultation bilaterally. Abdomen: Soft, nontender, nondistended, with no rigidity or guarding. Extremities: No obvious edema in the extremities, but the patient does have a compression sleeve on her right arm. Lymphatics: see Neck Exam  Skin: No concerning lesions. Musculoskeletal: Good range of motion in the shoulders. Neurologic: No obvious focalities. Speech is fluent. Coordination is intact. Psychiatric: Judgment and insight are intact. Affect is appropriate. Breasts: On examination of the left breast, no palpable masses noted in the breast or the axilla. On examination of the right breast, the patient has healed well in the right breast and axilla, with well healed scars along the areola and the axilla. She has good range of motion in her  shoulders and she has mild swelling in the axilla. Bilateral breast scars from breast reduction in the past. Modest edema in right axilla.  ECOG = 0  0 - Asymptomatic (Fully active, able to carry on all predisease activities without restriction)  1 - Symptomatic but completely ambulatory (Restricted in physically strenuous activity but ambulatory and able to carry out work of a light or sedentary nature. For example, light housework, office work)  2 - Symptomatic, <50% in bed during the day (Ambulatory and capable of all self care but unable to carry out any work activities. Up and about more than 50% of waking hours)  3 - Symptomatic, >50% in bed, but not bedbound (Capable of only limited self-care, confined to bed or chair 50% or more of waking hours)  4 - Bedbound (Completely disabled. Cannot carry on any self-care. Totally confined to bed or chair)  5 - Death   Eustace Pen MM, Creech RH, Tormey DC, et al. [phone]). "Toxicity and response criteria of the [employer]". [place] Oncol. 5 (6): 649-55   LABORATORY DATA:  Lab Results  Component Value Date   WBC 5.9 10/04/2016   HGB 13.3 10/04/2016   HCT 39.9 10/04/2016   MCV 92.4 10/04/2016   PLT  317 10/04/2016   CMP     Component Value Date/Time   NA 138 10/04/2016 1616   K 3.3 (L) 10/04/2016 1616   CL 104 10/04/2016 1616   CO2 26 10/04/2016 1616   GLUCOSE 104 (H) 10/04/2016 1616   BUN 12 10/04/2016 1616   CREATININE 0.97 10/04/2016 1616   CALCIUM 8.8 (L) 10/04/2016 1616   PROT 6.6 10/04/2016 1616   ALBUMIN 3.7 10/04/2016 1616   AST 27 10/04/2016 1616   ALT 15 10/04/2016 1616   ALKPHOS 44 10/04/2016 1616   BILITOT 0.4 10/04/2016 1616   GFRNONAA >60 10/04/2016 1616   GFRAA >60 10/04/2016 1616     RADIOGRAPHY: as above    IMPRESSION/PLAN: This is a well appearing 50 year old female with a diagnosis of Stage IA T1bN0M0 Right Breast Invasive Ductal Carcinoma, ER/PR Positive, post operative.  It was a  pleasure meeting the patient today. We discussed the risks, benefits, and side effects of radiotherapy. I recommend radiotherapy to the Right breast to reduce her risk of locoregional recurrence by 2/3.  We discussed that radiation would take approximately 6 weeks to complete and that I would give the patient a few weeks to heal following surgery before starting treatment planning.  If chemotherapy were to be given, this would precede radiotherapy. We spoke about acute effects including skin irritation and fatigue as well as much less common late effects including internal organ injury or irritation. We spoke about the latest technology that is used to minimize the risk of late effects for patients undergoing radiotherapy to the breast or chest wall. No guarantees of treatment were given. The patient is enthusiastic about proceeding with treatment. A consent form was discussed and signed.  I look forward to participating in the patient's care.  I will await her referral back to me by medical oncology for  eventual CT simulation/treatment planning. First, her systemic therapy plans warrant discussion with Dr Hopkins given her high risk Oncotype Dx score.  The patient was given my contact card today if issues or questions arise.  I spent at least 45 minutes face to face with the patient and more than 50% of that time was spent in counseling and/or coordination of care.   __________________________________________    , MD  This document serves as a record of services personally performed by  , MD. It was created on her behalf by  Singleton, a trained medical scribe. The creation of this record is based on the scribe's personal observations and the provider's statements to them. This document has been checked and approved by the attending provider. 

## 2016-11-17 ENCOUNTER — Other Ambulatory Visit: Payer: Self-pay | Admitting: General Surgery

## 2016-11-18 ENCOUNTER — Encounter (HOSPITAL_COMMUNITY): Payer: Self-pay | Admitting: *Deleted

## 2016-11-20 NOTE — H&P (Signed)
Gina Dickson Location: [facility] Patient #: [id number] DOB: 1966-06-08 Single / Language: Gina Dickson / Race: White Female        History of Present Illness.  The patient is a 50 year old female who presents with breast cancer. This is a 50 year old female who returns for her first postop visit following definitive surgery for her right breast cancer.      She has a history of bilateral reduction mammoplasties in 2005 but no past history of pathologic breast disease. Preop imaging and biopsy show a 7 mm invasive ductal carcinoma, right breast, 10 o'clock position, posterior to the areolar margin. Receptor positive, HER-2 negative. On October 06, 2016 she underwent right breast lumpectomy with radioactive seed localization and right axillary deep sentinel lymph node biopsy. Final pathology shows multifocal invasive ductal carcinoma. The larger focus measured 1 cm and is grade 3. Immediately nearby there was a smaller invasive focus 5 mm in diameter. Margins are negative. 4 sentinel nodes are negative. ER 95%. PR 10%. HER-2 negative. Ki-67 25%. Stage TIb, N0. I went over the pathology report with her and gave her a copy       She is very upbeat. Says she is doing great. She asked he has a little bit of pain and numbness under the right arm feels like it's a little swollen. She has a regular because more comfortable than the breast binder. She has an appointment with her oncologist, Dr. Verdell Carmine in [facility] 2 days from now. I told her Dr. Georgiann Cocker would discuss her comprehensive plan including antiestrogen therapy and whether or not she needed an Oncotype assay to determine whether she needs chemotherapy. She wanted to be referred to radiation oncology here in [place] and that referral was made today.     I will see her in 3 months Hopefully she will be through her radiation therapy at that time If she gets an Oncotype that turns out to be high risk I'll be  happy to put a Port-A-Cath in I have not ordered any further imaging but will defer that to the other physicians Plan would be bilateral 3-D mammograms and bilateral breast MRI in 1 year. Possibly will be presented at our tumor board this Wednesday.       ADDENDUM (11/17/2016): oncotype high risk. Dr. Georgiann Cocker requests a port a cath.  Scheduled for 11/22/2016.    Allergies  Alcohol (Rubbing) *CHEMICALS*  Tape 1"X5yd *MEDICAL DEVICES AND SUPPLIES*  COPOLYMER-1  Morphine Sulfate *ANALGESICS - OPIOID*  Erythrocin Lactobionate *MACROLIDES*   Medication History ) [name] (3-0.02MG Tablet, Oral) Active.  Vitals  Weight: 165 lb Height: 65in Body Surface Area: 1.82 m Body Mass Index: 27.46 kg/m  Temp.: 98.31F  Pulse: 71 (Regular)  BP: 102/68 (Sitting, Left Arm, Standard)    Physical Exam  General Note: Very pleasant and cooperative. Very upbeat. No distress. Mental status normal.  Neck:  No adenopathy or thyroid mass. Trachea midline.  Lungs: clear bilaterally  CV:  RRR, no ectopy or murmer. Peripheral pulses full throughout.  Neuro:   Alert and oriented X 4. Mental status normal.   MOE X 4 with 5/5 strength.  Breast Note: Right breast shows circumareolar incision laterally healing well. No ischemia. Some sensory deficit of the areola and nipple. Good contour without much flattening at all. A little bit thickened but no obvious hematoma or seroma. Right axillary incision is healing nicely. Very soft without fluid collection. Subjectively a little bit numb under the arm. No objective arm  swelling. Full range of motion right shoulder. she says this hurts a little bit.     Assessment & Plan  PRIMARY CANCER OF UPPER OUTER QUADRANT OF RIGHT FEMALE BREAST (C50.411)   You are recovering from your right breast lumpectomy and right axillary sentinel node biopsy without any major surgical complications. The wounds look good and they are healing without any signs  of hematoma or infection  We reviewed the pathology report which shows that all 4 nodes are negative. There were 2 tumors in the breast, 1 cm and 0.5 cm. These have been completely excised and the margins are negative. It is unlikely that you would have further tumors in the breast. I do not think that an MRI would be helpful at this point in time due to the intense reaction from scarring.  You will be referred to radiation oncology at [facility], which is your preference. We are sending that referral in today. Radiation therapy will be part of your treatment plan.  Keep your appointment with your oncologist, Dr. Verdell Carmine in [facility]. She will decide about other forms of therapy including antiestrogen pills Dr. Georgiann Cocker referred you back for port a cath insertion as your Oncotype score showed high risk. We will schedule this procedure ASAP.  Otherwise, return to see Dr. Dalbert Batman in 3 months At 1 year, we will plan mammograms and bilateral breast MRI.  OTHER ULCERATIVE COLITIS WITH OTHER COMPLICATION (J18.841) HISTORY OF C-SECTION (Y60.630) HISTORY OF BILATERAL BREAST REDUCTION SURGERY (Z98.890) FAMILY HISTORY OF BREAST CANCER IN FEMALE (Z80.3)    [name] M. Dalbert Batman, M.D., [facility] General and Minimally invasive Surgery Breast and Colorectal Surgery Office:   [phone] Pager:   [phone]

## 2016-11-21 MED ORDER — CEFAZOLIN SODIUM-DEXTROSE 2-4 GM/100ML-% IV SOLN
2.0000 g | INTRAVENOUS | Status: AC
  Administered 2016-11-22: 2 g via INTRAVENOUS
  Filled 2016-11-21: qty 100

## 2016-11-21 MED ORDER — ACETAMINOPHEN 500 MG PO TABS
1000.0000 mg | ORAL_TABLET | ORAL | Status: AC
  Administered 2016-11-22: 1000 mg via ORAL
  Filled 2016-11-21: qty 2

## 2016-11-21 MED ORDER — CELECOXIB 200 MG PO CAPS
400.0000 mg | ORAL_CAPSULE | ORAL | Status: DC
  Filled 2016-11-21: qty 2

## 2016-11-21 MED ORDER — GABAPENTIN 300 MG PO CAPS
300.0000 mg | ORAL_CAPSULE | ORAL | Status: DC
  Filled 2016-11-21: qty 1

## 2016-11-22 ENCOUNTER — Ambulatory Visit (HOSPITAL_COMMUNITY): Payer: Managed Care, Other (non HMO)

## 2016-11-22 ENCOUNTER — Encounter (HOSPITAL_COMMUNITY)
Admission: RE | Disposition: A | Discharge status: Home / Self Care | Payer: Self-pay | Source: Ambulatory Visit | Attending: General Surgery

## 2016-11-22 ENCOUNTER — Ambulatory Visit (HOSPITAL_COMMUNITY): Payer: Managed Care, Other (non HMO) | Admitting: Certified Registered Nurse Anesthetist

## 2016-11-22 ENCOUNTER — Ambulatory Visit (HOSPITAL_COMMUNITY)
Admission: RE | Admit: 2016-11-22 | Discharge: 2016-11-22 | Disposition: A | Discharge status: Home / Self Care | Payer: Managed Care, Other (non HMO) | Source: Ambulatory Visit | Attending: General Surgery | Admitting: General Surgery

## 2016-11-22 DIAGNOSIS — Z170 Estrogen receptor positive status [ER+]: Secondary | ICD-10-CM | POA: Diagnosis not present

## 2016-11-22 DIAGNOSIS — C50411 Malignant neoplasm of upper-outer quadrant of right female breast: Secondary | ICD-10-CM | POA: Insufficient documentation

## 2016-11-22 DIAGNOSIS — Z95828 Presence of other vascular implants and grafts: Secondary | ICD-10-CM

## 2016-11-22 DIAGNOSIS — Z803 Family history of malignant neoplasm of breast: Secondary | ICD-10-CM | POA: Diagnosis not present

## 2016-11-22 DIAGNOSIS — Z419 Encounter for procedure for purposes other than remedying health state, unspecified: Secondary | ICD-10-CM

## 2016-11-22 HISTORY — DX: Post-traumatic stress disorder, unspecified: F43.10

## 2016-11-22 HISTORY — PX: PORTACATH PLACEMENT: SHX2246

## 2016-11-22 HISTORY — DX: Anxiety disorder, unspecified: F41.9

## 2016-11-22 HISTORY — DX: Ulcerative colitis, unspecified, without complications: K51.90

## 2016-11-22 LAB — CBC
HEMATOCRIT: 40.1 % (ref 36.0–46.0)
Hemoglobin: 13.4 g/dL (ref 12.0–15.0)
MCH: 30.7 pg (ref 26.0–34.0)
MCHC: 33.4 g/dL (ref 30.0–36.0)
MCV: 91.8 fL (ref 78.0–100.0)
PLATELETS: 300 10*3/uL (ref 150–400)
RBC: 4.37 MIL/uL (ref 3.87–5.11)
RDW: 13.5 % (ref 11.5–15.5)
WBC: 9.8 10*3/uL (ref 4.0–10.5)

## 2016-11-22 SURGERY — INSERTION, TUNNELED CENTRAL VENOUS DEVICE, WITH PORT
Anesthesia: General | Site: Breast

## 2016-11-22 MED ORDER — PROPOFOL 10 MG/ML IV BOLUS
INTRAVENOUS | Status: DC | PRN
  Administered 2016-11-22: 200 mg via INTRAVENOUS

## 2016-11-22 MED ORDER — SODIUM CHLORIDE 0.9% FLUSH: 3.0000 mL | Freq: Two times a day (BID) | INTRAVENOUS | Status: DC

## 2016-11-22 MED ORDER — ONDANSETRON HCL 4 MG/2ML IJ SOLN: 4.0000 mg | Freq: Four times a day (QID) | INTRAMUSCULAR | Status: DC | PRN

## 2016-11-22 MED ORDER — FENTANYL CITRATE (PF) 100 MCG/2ML IJ SOLN: 25.0000 ug | INTRAMUSCULAR | Status: DC | PRN

## 2016-11-22 MED ORDER — SODIUM CHLORIDE 0.9 % IV SOLN: 250.0000 mL | INTRAVENOUS | Status: DC | PRN

## 2016-11-22 MED ORDER — SODIUM CHLORIDE 0.9% FLUSH: 3.0000 mL | INTRAVENOUS | Status: DC | PRN

## 2016-11-22 MED ORDER — MIDAZOLAM HCL 5 MG/5ML IJ SOLN
INTRAMUSCULAR | Status: DC | PRN
  Administered 2016-11-22: 2 mg via INTRAVENOUS

## 2016-11-22 MED ORDER — METOCLOPRAMIDE HCL 5 MG/ML IJ SOLN
INTRAMUSCULAR | Status: DC | PRN
  Administered 2016-11-22: 10 mg via INTRAVENOUS

## 2016-11-22 MED ORDER — OXYCODONE HCL 5 MG/5ML PO SOLN: 5.0000 mg | Freq: Once | ORAL | Status: DC | PRN

## 2016-11-22 MED ORDER — LIDOCAINE HCL (CARDIAC) 20 MG/ML IV SOLN
INTRAVENOUS | Status: DC | PRN
  Administered 2016-11-22: 60 mg via INTRAVENOUS

## 2016-11-22 MED ORDER — HEPARIN SODIUM (PORCINE) 5000 UNIT/ML IJ SOLN
INTRAMUSCULAR | Status: DC | PRN
  Administered 2016-11-22: 500 mL

## 2016-11-22 MED ORDER — MIDAZOLAM HCL 2 MG/2ML IJ SOLN
INTRAMUSCULAR | Status: AC
  Filled 2016-11-22: qty 2

## 2016-11-22 MED ORDER — LIDOCAINE 2% (20 MG/ML) 5 ML SYRINGE
INTRAMUSCULAR | Status: AC
  Filled 2016-11-22: qty 5

## 2016-11-22 MED ORDER — LIDOCAINE-EPINEPHRINE (PF) 1 %-1:200000 IJ SOLN
INTRAMUSCULAR | Status: DC | PRN
  Administered 2016-11-22: 30 mL

## 2016-11-22 MED ORDER — ONDANSETRON HCL 4 MG/2ML IJ SOLN
INTRAMUSCULAR | Status: DC | PRN
  Administered 2016-11-22: 4 mg via INTRAVENOUS

## 2016-11-22 MED ORDER — LIDOCAINE-EPINEPHRINE (PF) 1 %-1:200000 IJ SOLN
INTRAMUSCULAR | Status: AC
  Filled 2016-11-22: qty 30

## 2016-11-22 MED ORDER — LACTATED RINGERS IV SOLN: INTRAVENOUS | Status: DC

## 2016-11-22 MED ORDER — ACETAMINOPHEN 325 MG PO TABS: 650.0000 mg | ORAL_TABLET | ORAL | Status: DC | PRN

## 2016-11-22 MED ORDER — CHLORHEXIDINE GLUCONATE CLOTH 2 % EX PADS: 6.0000 | MEDICATED_PAD | Freq: Once | CUTANEOUS | Status: DC

## 2016-11-22 MED ORDER — OXYCODONE HCL 5 MG PO TABS: 5.0000 mg | ORAL_TABLET | Freq: Once | ORAL | Status: DC | PRN

## 2016-11-22 MED ORDER — IOPAMIDOL (ISOVUE-300) INJECTION 61%
INTRAVENOUS | Status: AC
  Filled 2016-11-22: qty 50

## 2016-11-22 MED ORDER — HYDROCODONE-ACETAMINOPHEN 5-325 MG PO TABS: 1.0000 | ORAL_TABLET | Freq: Four times a day (QID) | ORAL | 0 refills | Status: DC | PRN

## 2016-11-22 MED ORDER — OXYCODONE HCL 5 MG PO TABS
ORAL_TABLET | ORAL | Status: AC
  Filled 2016-11-22: qty 1

## 2016-11-22 MED ORDER — EPHEDRINE SULFATE 50 MG/ML IJ SOLN
INTRAMUSCULAR | Status: DC | PRN
  Administered 2016-11-22: 5 mg via INTRAVENOUS

## 2016-11-22 MED ORDER — FENTANYL CITRATE (PF) 100 MCG/2ML IJ SOLN
INTRAMUSCULAR | Status: DC | PRN
  Administered 2016-11-22: 25 ug via INTRAVENOUS

## 2016-11-22 MED ORDER — LACTATED RINGERS IV SOLN
INTRAVENOUS | Status: DC
  Administered 2016-11-22: 14:00:00 via INTRAVENOUS

## 2016-11-22 MED ORDER — HEPARIN SOD (PORK) LOCK FLUSH 100 UNIT/ML IV SOLN
INTRAVENOUS | Status: DC | PRN
  Administered 2016-11-22: 500 [IU] via INTRAVENOUS

## 2016-11-22 MED ORDER — 0.9 % SODIUM CHLORIDE (POUR BTL) OPTIME
TOPICAL | Status: DC | PRN
  Administered 2016-11-22: 1000 mL

## 2016-11-22 MED ORDER — FENTANYL CITRATE (PF) 100 MCG/2ML IJ SOLN
INTRAMUSCULAR | Status: AC
  Filled 2016-11-22: qty 2

## 2016-11-22 MED ORDER — DEXAMETHASONE SODIUM PHOSPHATE 10 MG/ML IJ SOLN
INTRAMUSCULAR | Status: DC | PRN
  Administered 2016-11-22: 10 mg via INTRAVENOUS

## 2016-11-22 MED ORDER — OXYCODONE HCL 5 MG PO TABS
5.0000 mg | ORAL_TABLET | ORAL | Status: DC | PRN
  Administered 2016-11-22: 5 mg via ORAL

## 2016-11-22 MED ORDER — SCOPOLAMINE 1 MG/3DAYS TD PT72
MEDICATED_PATCH | TRANSDERMAL | Status: AC
  Administered 2016-11-22: 1.5 mg
  Filled 2016-11-22: qty 1

## 2016-11-22 MED ORDER — PROPOFOL 10 MG/ML IV BOLUS
INTRAVENOUS | Status: AC
  Filled 2016-11-22: qty 40

## 2016-11-22 MED ORDER — HEPARIN SOD (PORK) LOCK FLUSH 100 UNIT/ML IV SOLN
INTRAVENOUS | Status: AC
  Filled 2016-11-22: qty 5

## 2016-11-22 MED ORDER — ACETAMINOPHEN 650 MG RE SUPP: 650.0000 mg | RECTAL | Status: DC | PRN

## 2016-11-22 SURGICAL SUPPLY — 50 items
BAG DECANTER FOR FLEXI CONT (MISCELLANEOUS) ×2 IMPLANT
BINDER BREAST LRG (GAUZE/BANDAGES/DRESSINGS) ×2 IMPLANT
BLADE SURG 11 STRL SS (BLADE) ×2 IMPLANT
BLADE SURG 15 STRL LF DISP TIS (BLADE) ×1 IMPLANT
BLADE SURG 15 STRL SS (BLADE) ×1
BLADE SURG ROTATE 9660 (MISCELLANEOUS) IMPLANT
CANISTER SUCTION 2500CC (MISCELLANEOUS) IMPLANT
CHLORAPREP W/TINT 26ML (MISCELLANEOUS) ×2 IMPLANT
COVER SURGICAL LIGHT HANDLE (MISCELLANEOUS) ×2 IMPLANT
COVER TRANSDUCER ULTRASND GEL (DRAPE) IMPLANT
CRADLE DONUT ADULT HEAD (MISCELLANEOUS) ×2 IMPLANT
DERMABOND ADVANCED (GAUZE/BANDAGES/DRESSINGS) ×1
DERMABOND ADVANCED .7 DNX12 (GAUZE/BANDAGES/DRESSINGS) ×1 IMPLANT
DRAPE C-ARM 42X72 X-RAY (DRAPES) ×2 IMPLANT
DRAPE LAPAROSCOPIC ABDOMINAL (DRAPES) ×2 IMPLANT
DRAPE UTILITY XL STRL (DRAPES) ×4 IMPLANT
ELECT CAUTERY BLADE 6.4 (BLADE) ×2 IMPLANT
ELECT REM PT RETURN 9FT ADLT (ELECTROSURGICAL) ×2
ELECTRODE REM PT RTRN 9FT ADLT (ELECTROSURGICAL) ×1 IMPLANT
GAUZE SPONGE 4X4 16PLY XRAY LF (GAUZE/BANDAGES/DRESSINGS) ×2 IMPLANT
GLOVE EUDERMIC 7 POWDERFREE (GLOVE) ×2 IMPLANT
GOWN STRL REUS W/ TWL LRG LVL3 (GOWN DISPOSABLE) ×1 IMPLANT
GOWN STRL REUS W/ TWL XL LVL3 (GOWN DISPOSABLE) ×1 IMPLANT
GOWN STRL REUS W/TWL LRG LVL3 (GOWN DISPOSABLE) ×1
GOWN STRL REUS W/TWL XL LVL3 (GOWN DISPOSABLE) ×1
INTRODUCER 13FR (MISCELLANEOUS) IMPLANT
INTRODUCER COOK 11FR (CATHETERS) IMPLANT
KIT BASIN OR (CUSTOM PROCEDURE TRAY) ×2 IMPLANT
KIT PORT POWER 8FR ISP CVUE (Catheter) ×2 IMPLANT
KIT ROOM TURNOVER OR (KITS) ×2 IMPLANT
NEEDLE HYPO 25GX1X1/2 BEV (NEEDLE) ×4 IMPLANT
NS IRRIG 1000ML POUR BTL (IV SOLUTION) ×2 IMPLANT
PACK SURGICAL SETUP 50X90 (CUSTOM PROCEDURE TRAY) ×2 IMPLANT
PAD ARMBOARD 7.5X6 YLW CONV (MISCELLANEOUS) ×2 IMPLANT
PENCIL BUTTON HOLSTER BLD 10FT (ELECTRODE) ×2 IMPLANT
SET INTRODUCER 12FR PACEMAKER (SHEATH) IMPLANT
SET SHEATH INTRODUCER 10FR (MISCELLANEOUS) IMPLANT
SHEATH COOK PEEL AWAY SET 9F (SHEATH) IMPLANT
SPONGE GAUZE 4X4 12PLY STER LF (GAUZE/BANDAGES/DRESSINGS) ×4 IMPLANT
SURGILUBE 3G PEEL PACK STRL (MISCELLANEOUS) IMPLANT
SUT MNCRL AB 4-0 PS2 18 (SUTURE) ×2 IMPLANT
SUT PROLENE 2 0 CT2 30 (SUTURE) ×2 IMPLANT
SUT VIC AB 3-0 SH 18 (SUTURE) ×4 IMPLANT
SYR 5ML LUER SLIP (SYRINGE) ×2 IMPLANT
SYR CONTROL 10ML LL (SYRINGE) ×2 IMPLANT
SYRINGE 10CC LL (SYRINGE) ×4 IMPLANT
TOWEL OR 17X24 6PK STRL BLUE (TOWEL DISPOSABLE) ×2 IMPLANT
TOWEL OR 17X26 10 PK STRL BLUE (TOWEL DISPOSABLE) ×2 IMPLANT
TUBE CONNECTING 12X1/4 (SUCTIONS) IMPLANT
YANKAUER SUCT BULB TIP NO VENT (SUCTIONS) IMPLANT

## 2016-11-22 NOTE — Discharge Instructions (Signed)
    PORT-A-CATH: POST OP INSTRUCTIONS  Always review your discharge instruction sheet given to you by the facility where your surgery was performed.   1. A prescription for pain medication may be given to you upon discharge. Take your pain medication as prescribed, if needed. If narcotic pain medicine is not needed, then you make take acetaminophen (Tylenol) or ibuprofen (Advil) as needed.  2. Take your usually prescribed medications unless otherwise directed. 3. If you need a refill on your pain medication, please contact our office. All narcotic pain medicine now requires a paper prescription.  Phoned in and fax refills are no longer allowed by law.  Prescriptions will not be filled after 5 pm or on weekends.  4. You should follow a light diet for the remainder of the day after your procedure. 5. Most patients will experience some mild swelling and/or bruising in the area of the incision. It may take several days to resolve. 6. It is common to experience some constipation if taking pain medication after surgery. Increasing fluid intake and taking a stool softener (such as Colace) will usually help or prevent this problem from occurring. A mild laxative (Milk of Magnesia or Miralax) should be taken according to package directions if there are no bowel movements after 48 hours.  7. Unless discharge instructions indicate otherwise, you may remove your bandages 48 hours after surgery, and you may shower at that time. You may have steri-strips (small white skin tapes) in place directly over the incision.  These strips should be left on the skin for 7-10 days.  If your surgeon used Dermabond (skin glue) on the incision, you may shower in 24 hours.  The glue will flake off over the next 2-3 weeks.  8. If your port is left accessed at the end of surgery (needle left in port), the dressing cannot get wet and should only by changed by a healthcare professional. When the port is no longer accessed (when the  needle has been removed), follow step 7.   9. ACTIVITIES:  Limit activity involving your arms for the next 72 hours. Do no strenuous exercise or activity for 1 week. You may drive when you are no longer taking prescription pain medication, you can comfortably wear a seatbelt, and you can maneuver your car. 10.You may need to see your doctor in the office for a follow-up appointment.  Please       check with your doctor.  11.When you receive a new Port-a-Cath, you will get a product guide and        ID card.  Please keep them in case you need them.  WHEN TO CALL YOUR DOCTOR ([phone]): 1. Fever over 101.0 2. Chills 3. Continued bleeding from incision 4. Increased redness and tenderness at the site 5. Shortness of breath, difficulty breathing   The clinic staff is available to answer your questions during regular business hours. Please don't hesitate to call and ask to speak to one of the nurses or medical assistants for clinical concerns. If you have a medical emergency, go to the nearest emergency room or call 911.  A surgeon from [facility] is always on call at the hospital.     For further information, please visit [email or web address]      

## 2016-11-22 NOTE — Progress Notes (Signed)
Dr Lolita Lenz stated pt could for go preg test.

## 2016-11-22 NOTE — Anesthesia Procedure Notes (Signed)
Procedure Name: LMA Insertion Date/Time: 11/22/2016 3:14 PM Performed by: Valda Favia Pre-anesthesia Checklist: Patient identified, Emergency Drugs available, Suction available, Patient being monitored and Timeout performed Patient Re-evaluated:Patient Re-evaluated prior to inductionOxygen Delivery Method: Circle system utilized Preoxygenation: Pre-oxygenation with 100% oxygen Intubation Type: IV induction Ventilation: Mask ventilation without difficulty LMA: LMA inserted LMA Size: 4.0 Number of attempts: 1 Placement Confirmation: positive ETCO2 and breath sounds checked- equal and bilateral Tube secured with: Tape Dental Injury: Teeth and Oropharynx as per pre-operative assessment

## 2016-11-22 NOTE — Op Note (Signed)
Patient Name:           Gina Dickson   Date of Surgery:        11/22/2016  Pre op Diagnosis:      Invasive ductal carcinoma right breast  Post op Diagnosis:    Same  Procedure:                 Insertion of 8 French power port clearVue  tunneled venous vascular access device with subcutaneous port                                      Use of fluoroscopy for guidance and positioning  Surgeon:                     Edsel Petrin. Dalbert Batman, M.D., FACS  Assistant:                      OR staff   Indication for Assistant: n/a  Operative Indications:   . This is a 51 year old female who returns for her first postop visit following definitive surgery for her right breast cancer.      She has a history of bilateral reduction mammoplasties in 2005 but no past history of pathologic breast disease. Preop imaging and biopsy show a 7 mm invasive ductal carcinoma, right breast, 10 o'clock position, posterior to the areolar margin. Receptor positive, HER-2 negative. On October 06, 2016 she underwent right breast lumpectomy with radioactive seed localization and right axillary deep sentinel lymph node biopsy. Final pathology shows multifocal invasive ductal carcinoma. The larger focus measured 1 cm and is grade 3. Immediately nearby there was a smaller invasive focus 5 mm in diameter. Margins are negative. 4 sentinel nodes are negative. ER 95%. PR 10%. HER-2 negative. Ki-67 25%. Stage TIb, N0. I went over the pathology report with her and gave her a copy.  Oncotype score shows high risk for recurrence on tamoxifen alone.  She has been advised to undergo chemotherapy by Dr. Verdell Carmine, her oncologist in [facility], and she was referred back for Port-A-Cath insertion.  She wanted to be referred to radiation oncology here in [place] and that referral was made.   She states that she will begin chemotherapy this Friday.  She will be referred to radiation oncology after she completes her  chemotherapy. Hopefully she will be through her radiation therapy at that time  Plan would be bilateral 3-D mammograms and bilateral breast MRI in 1 year. I will see her back in about 3 months . Operative Findings:       The port was placed through the left subclavian vein.  I made a single pass with the venipuncture needle and the procedure was uneventful.  At the completion of the case fluoroscopy confirmed the tip of the catheter in the superior vena cava near the right atrial junction and no deformity of the catheter anywhere along its course.  At the completion of the case the catheter flushed well and had excellent blood return.  The port and catheter were flushed with heparinized saline.  Procedure in Detail:          Following the induction of general LMA anesthesia small roll was placed behind the patient's shoulders and her arms were tucked at her sides.  The neck and chest was prepped and draped in a sterile fashion.  Surgical timeout was performed.  Intravenous antibiotics were given.  1% Xylocaine with epinephrine was used as a local infiltration anesthetic.     A left subclavian venipuncture was performed with a single pass with excellent blood return and the guidewire was inserted into the superior vena cava under fluoroscopic guidance.  Using the C-arm I marked a template on the chest wall to guide catheter length and positioning.  A small incision was made at the wire insertion site.  I then made a transverse incision below the midpoint of the clavicle about 2 cm away from the wire insertion site.  I made a pocket at the level of the pectoralis fascia..  Using a tunneling device I passed the catheter from the port pocket site to the wire insertion site.  Using the template on the chest wall I measured the catheter and cut it 22.5 cm in length.  The catheter was secured to the port with the locking device and the port and catheter flushed with heparinized saline.  The port was sutured to  the pectoralis fascia with 3 interrupted sutures of 2-0 Prolene.      I inserted the dilator and peel-away sheath December the over the guidewire into the central venous circulation, removed the wire and dilator, threaded the catheter to the peel-away sheath without difficulty and removed the peel-away sheath.  The catheter flushed well and had excellent blood return.  The catheter was flushed with heparinized saline.  Fluoroscopy confirmed good positioning of the catheter and the port as described above.  The port and catheter were flushed with concentrated heparin.  There was no bleeding.  60s tissue was closed with 3-0 Vicryl sutures and the skin incisions closed with subcuticular 4-0 Monocryl.  Because of her multiple allergies to glue and tape, we simply placed 4 x 4 gauze and a breast binder to hold the bandage in place.     The patient tolerated the procedure well and was taken to PACU in stable condition where a chest x-ray will be performed.  EBL 10 mL.  Counts correct.  Complications none.     Edsel Petrin. Dalbert Batman, M.D., FACS General and Minimally Invasive Surgery Breast and Colorectal Surgery  11/22/2016 4:06 PM

## 2016-11-22 NOTE — Transfer of Care (Signed)
Immediate Anesthesia Transfer of Care Note  Patient: Almyra Koral  Procedure(s) Performed: Procedure(s): INSERTION PORT-A-CATH WITH [place] (N/A)  Patient Location: PACU  Anesthesia Type:General  Level of Consciousness: awake and alert   Airway & Oxygen Therapy: Patient Spontanous Breathing and Patient connected to nasal cannula oxygen  Post-op Assessment: Report given to RN and Post -op Vital signs reviewed and stable  Post vital signs: Reviewed and stable  Last Vitals:  Vitals:   11/22/16 1303 11/22/16 1610  BP:    Pulse:    Resp:    Temp: 37.2 C 36.5 C    Last Pain:  Vitals:   11/22/16 1302  TempSrc: Oral      Patients Stated Pain Goal: 3 ([id number])  Complications: No apparent anesthesia complications

## 2016-11-22 NOTE — Interval H&P Note (Signed)
History and Physical Interval Note:  11/22/2016 12:52 PM  Gina Dickson  has presented today for surgery, with the diagnosis of RIGHT BREAST CANCER  The various methods of treatment have been discussed with the patient and family. After consideration of risks, benefits and other options for treatment, the patient has consented to  Procedure(s): INSERTION PORT-A-CATH WITH [place] (N/A) as a surgical intervention .  The patient's history has been reviewed, patient examined, no change in status, stable for surgery.  I have reviewed the patient's chart and labs.  Questions were answered to the patient's satisfaction.     Adin Hector

## 2016-11-22 NOTE — Anesthesia Preprocedure Evaluation (Signed)
Anesthesia Evaluation  Patient identified by MRN, date of birth, ID band Patient awake    History of Anesthesia Complications (+) PONV and history of anesthetic complications  Airway Mallampati: II   Neck ROM: full    Dental   Pulmonary neg pulmonary ROS,    breath sounds clear to auscultation       Cardiovascular negative cardio ROS   Rhythm:regular Rate:Normal     Neuro/Psych PSYCHIATRIC DISORDERS Anxiety    GI/Hepatic PUD,   Endo/Other    Renal/GU      Musculoskeletal   Abdominal   Peds  Hematology   Anesthesia Other Findings   Reproductive/Obstetrics                             Anesthesia Physical Anesthesia Plan  ASA: II  Anesthesia Plan: General   Post-op Pain Management:    Induction: Intravenous  Airway Management Planned: LMA  Additional Equipment:   Intra-op Plan:   Post-operative Plan:   Informed Consent: I have reviewed the patients History and Physical, chart, labs and discussed the procedure including the risks, benefits and alternatives for the proposed anesthesia with the patient or authorized representative who has indicated his/her understanding and acceptance.     Plan Discussed with: CRNA, Anesthesiologist and Surgeon  Anesthesia Plan Comments:         Anesthesia Quick Evaluation

## 2016-11-23 ENCOUNTER — Encounter (HOSPITAL_COMMUNITY): Payer: Self-pay | Admitting: General Surgery

## 2016-11-23 NOTE — Anesthesia Postprocedure Evaluation (Signed)
Anesthesia Post Note  Patient: Gina Dickson  Procedure(s) Performed: Procedure(s) (LRB): INSERTION PORT-A-CATH WITH [place] (N/A)  Patient location during evaluation: PACU Anesthesia Type: General Level of consciousness: awake and alert and patient cooperative Pain management: pain level controlled Vital Signs Assessment: post-procedure vital signs reviewed and stable Respiratory status: spontaneous breathing and respiratory function stable Cardiovascular status: stable Anesthetic complications: no       Last Vitals:  Vitals:   11/22/16 1725 11/22/16 1740  BP: 125/65 124/66  Pulse:  71  Resp: 12 16  Temp:      Last Pain:  Vitals:   11/22/16 1710  TempSrc:   PainSc: 0-No pain                 [name] S

## 2016-12-02 ENCOUNTER — Ambulatory Visit: Payer: Managed Care, Other (non HMO) | Admitting: Radiation Oncology

## 2016-12-02 ENCOUNTER — Ambulatory Visit: Payer: Managed Care, Other (non HMO)

## 2017-03-07 ENCOUNTER — Encounter: Payer: Self-pay | Admitting: Radiation Oncology

## 2017-03-10 NOTE — Progress Notes (Signed)
Location of Breast Cancer: Right Breast  Histology per Pathology Report:  09/22/16 Diagnosis Breast, right, needle core biopsy, 10 o'clock 3cmfn INVASIVE DUCTAL CARCINOMA, GRADE 3 DUCTAL CARCINOMA IN SITU  Receptor Status: ER(95%), PR (10%)  10/06/16 Diagnosis 1. Breast, lumpectomy, Right - MULTIFOCAL INVASIVE DUCTAL CARCINOMA. - LARGER INVASIVE FOCUS MEASURES 1 CM IN GREATEST DIMENSION AND IS GRADE 3. - SMALLER NEARBY SATELLITE INVASIVE FOCUS MEASURES UP TO 0.5 CM IN GREATEST DIMENSION AND IS GRADE 2. - INTERMEDIATE GRADE DUCTAL CARCINOMA IN SITU. - MARGINS ARE NEGATIVE. - SEE ONCOLOGY TEMPLATE AND COMMENT. 2. Lymph node, sentinel, biopsy, Right axillary #1 - ONE BENIGN LYMPH NODE WITH NO TUMOR SEEN (0/1). 3. Lymph node, sentinel, biopsy, Right axillary #2 - ONE BENIGN LYMPH NODE WITH NO TUMOR SEEN (0/1). 4. Lymph node, sentinel, biopsy, Right axillary #3 - ONE BENIGN LYMPH NODE WITH NO TUMOR SEEN (0/1). 5. Lymph node, sentinel, biopsy, Right axillary #4 - ONE BENIGN LYMPH NODE WITH NO TUMOR SEEN (0/1).  Did patient present with symptoms or was this found on screening mammography?: It was found on a screening mammogram.   Past/Anticipated interventions by surgeon, if any: 10/06/16 Procedure:                 Inject blue dye right breast, right breast lumpectomy with radioactive seed localization, right axillary deep sentinel lymph node biopsy  Surgeon:                     Edsel Petrin. Dalbert Batman, M.D., [facility]   Past/Anticipated interventions by medical oncology, if any: She has seen Verdell Carmine MD who is an oncologist at [facility]. She prescribed Tamoxifen which she is currently taking.  Dr. Georgiann Cocker has sent her tumor for testing regarding possible chemotherapy.  She has paper work with her today from Dr. Georgiann Cocker office.   She is currently receiving chemotherapy with Dr. Georgiann Cocker in [place]. She will complete chemotherapy 04/07/17. She would like to start  radiation quickly. She has a trip planned for the end of June for 8-10 days and would like radiation to be completed by that time.    Lymphedema issues, if any:  She denies any at this time.  She does tells me that she is working on getting a compression sleeve. She does perform exercises based on if she has swelling present to her arm.   Pain issues, if any:  She denies, she does report discomfort from swelling in her Right arm.   SAFETY ISSUES:  Prior radiation? No  Pacemaker/ICD? No  Possible current pregnancy? 10/04/16 serum pregnancy negative. She is not currently sexually active.   Is the patient on methotrexate? No  Current Complaints / other details:   The patient has had bilateral reduction mammoplasties in the past 2005,but no pathologic breast disease.  BP 108/78   Pulse 100   Temp 98.7 F (37.1 C)   Ht 5\' 5"  (1.651 m)   Wt 175 lb 6.4 oz (79.6 kg)   SpO2 99% Comment: room air  BMI 29.19 kg/m    Wt Readings from Last 3 Encounters:  03/15/17 175 lb 6.4 oz (79.6 kg)  11/16/16 169 lb 9.6 oz (76.9 kg)  10/04/16 163 lb 6.4 oz (74.1 kg)

## 2017-03-15 ENCOUNTER — Encounter: Payer: Self-pay | Admitting: Radiation Oncology

## 2017-03-15 ENCOUNTER — Ambulatory Visit
Admission: RE | Admit: 2017-03-15 | Discharge: 2017-03-15 | Disposition: A | Discharge status: Home / Self Care | Payer: Managed Care, Other (non HMO) | Source: Ambulatory Visit | Attending: Radiation Oncology | Admitting: Radiation Oncology

## 2017-03-15 DIAGNOSIS — Z9889 Other specified postprocedural states: Secondary | ICD-10-CM | POA: Insufficient documentation

## 2017-03-15 DIAGNOSIS — Z79899 Other long term (current) drug therapy: Secondary | ICD-10-CM | POA: Insufficient documentation

## 2017-03-15 DIAGNOSIS — Z170 Estrogen receptor positive status [ER+]: Secondary | ICD-10-CM | POA: Insufficient documentation

## 2017-03-15 DIAGNOSIS — C50411 Malignant neoplasm of upper-outer quadrant of right female breast: Secondary | ICD-10-CM | POA: Diagnosis present

## 2017-03-15 NOTE — Progress Notes (Signed)
Radiation Oncology         (336) [phone] ________________________________  Name: Gina Dickson MRN: [id number]  Date: 03/15/2017  DOB: [birth year]  Follow-Up Visit Note  Outpatient  CC: [name], MD  [name], MD  Diagnosis:      ICD-9-CM ICD-10-CM   1. 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 She was initially seen in consultation on 11/16/16.   Prior to consultation, the patient underwent lumpectomy, and 2 foci of invasive tumor were removed, measuring 1.0 and 0.5 cm in greatest dimension. Margins were negative by at least 0.5 cm to invasive and in situ disease. She has an oncotype score of 37 which is high risk. Her medical oncologist, Dr. Georgiann Cocker of [facility], previously started her on Tamoxifen.   According to notes from Dr. Georgiann Cocker clinic, the patient is currently receiving weekly Taxol while continuing on Tamoxifen. She follows up with Dr. Georgiann Cocker in about 1 week. The patient's Taxol was preceded by Adriamycin and Cytoxan.   Of note, the patient met with Lois Huxley of [place] in March. The patient chose to undergo genetic testing according to her note. I do not see the results in Care Everywhere.  The patient presents to the clinic today to discuss the role of radiation therapy in the treatment of her disease.   On review of systems, the patient denies lymphedema concerns at this time. She reports she is performing regular PT exercises when she notices swelling in her arm. The patient denies pain at this time, though she does report discomfort from swelling in her right arm. She reports she will finish chemotherapy on 04/07/17.   The  patient reports she has an 8-10 day trip planned for the end of June, and hopes radiation therapy can be completed before this time.            ALLERGIES:  is allergic to adhesive [tape]; alcohol [isopropyl alcohol]; erythromycin; morphine and related; and other.  Meds: Current Outpatient Prescriptions  Medication Sig Dispense Refill  . cetirizine (ZYRTEC) 10 MG tablet Take 10 mg by mouth every evening.    Marland Kitchen ibuprofen (ADVIL,MOTRIN) 200 MG tablet Take 200-400 mg by mouth 2 (two) times daily as needed for headache or moderate pain.     . naproxen sodium (ANAPROX) 220 MG tablet Take 220 mg by mouth 3 (three) times daily with meals.    . tamoxifen (NOLVADEX) 20 MG tablet Take 20 mg by mouth at bedtime.     Marland Kitchen CALCIUM-MAGNESIUM PO Take 1 tablet by mouth daily.    Marland Kitchen HYDROcodone-acetaminophen (NORCO) 5-325 MG tablet Take 1-2 tablets by mouth every 6 (six) hours as needed for moderate pain or severe pain. (Patient not taking: Reported on 03/15/2017) 30 tablet 0  . Multiple Vitamins-Minerals (MULTIVITAMIN WITH MINERALS) tablet Take 1 tablet by mouth daily.    . Omega-3 Fatty Acids (FISH OIL PO) Take 1 capsule by mouth daily.    . Polyvinyl Alcohol-Povidone (REFRESH OP) Apply 1 drop to eye 2 (two) times daily.     No current facility-administered medications for this encounter.     Physical Findings:  height is 5' 5"  (1.651 m) and weight is 175 lb 6.4  oz (79.6 kg). Her temperature is 98.7 F (37.1 C). Her blood pressure is 108/78 and her pulse is 100. Her oxygen saturation is 99%. .    General: Alert and oriented, in no acute distress. HEENT: Oropharynx and oral cavity are clear. Neck: Neck is supple, no palpable cervical or supraclavicular lymphadenopathy or masses. Heart: Regular in rate and rhythm with no murmurs. Chest: Clear to auscultation bilaterally. Abdomen: Soft, mildly tender to palpation, non distended. Extremities: No edema. Lymphatics: see Neck Exam Musculoskeletal: symmetric  strength and muscle tone throughout. Neurologic: No obvious focalities. Speech is fluent.  Psychiatric: Judgment and insight are intact. Affect is appropriate. Breast exam reveals surgical scars have healed well. She is status post breast reduction bilaterally. No palpable masses or adenopathy in the breasts or axillary regions bilaterally.  Lab Findings: Lab Results  Component Value Date   WBC 9.8 11/22/2016   HGB 13.4 11/22/2016   HCT 40.1 11/22/2016   MCV 91.8 11/22/2016   PLT 300 11/22/2016    Radiographic Findings: No results found.  Impression/Plan: This is a delightful 51 y.o. patient with Stage IA Multifocal Pathologic T1bN0M0 Right Breast Invasive Ductal Carcinoma, ER/PR positive, status post right lumpectomy. She will finish chemotherapy in mid May 2018.  We discussed my recommendations for adjuvant radiotherapy today. We discussed the new "in press" ASTRO guidelines for adjuvant RT to the breast - I acknowledged she has a choice between hypofractionation and standard fractionation schemes.  After much discussion: She prefers the shorter scheme. I think this is a good choice for her. She will undergo 4 weeks of radiation therapy to the right breast in order to reduce the risk of locoregional recurrence by 2/3.  The risks, benefits and side effects of this treatment were discussed in detail.  She understands that radiotherapy is associated with skin irritation and fatigue in the acute setting. Late effects can include cosmetic changes and rare injury to internal organs.   She is enthusiastic about proceeding with treatment. A consent form has been signed and placed in her chart.  Per the patient's planned trip at the end of June, we discussed that starting radiation therapy before the trip would not give her adequate time to heal from chemotherapy before beginning radiation. The patient is in agreement to proceed with CT simulation and treatment planning before her trip, with the plan  to begin treatment upon her return.  A total of 3 medically necessary complex treatment devices will be fabricated and supervised by me: 2 fields with MLCs for custom blocks to protect heart, and lungs;  and, a Vac-lok. MORE COMPLEX DEVICES MAY BE MADE IN DOSIMETRY FOR FIELD IN FIELD BEAMS FOR DOSE HOMOGENEITY.  I have requested : 3D Simulation which is medically necessary to give adequate dose to at risk tissues while sparing lungs and heart.  I have requested a DVH of the following structures: lungs, heart, right lumpectomy cavity.    The patient will receive 40.05 Gy in 15 fractions to the right breast with 2 fields.  This will be followed by a boost.  I spent 40 minutes minutes face to face with the patient and more than 50% of that time was spent in counseling and/or coordination of care. _____________________________________   Eppie Gibson, MD  This document serves as a record of services personally performed by Eppie Gibson, MD. It was created on her behalf by Maryla Morrow, a trained medical scribe. The creation of this record is based on the scribe's personal observations and  the provider's statements to them. This document has been checked and approved by the attending provider.

## 2017-03-24 ENCOUNTER — Telehealth: Payer: Self-pay | Admitting: Radiation Oncology

## 2017-03-24 DIAGNOSIS — G620 Drug-induced polyneuropathy: Secondary | ICD-10-CM | POA: Insufficient documentation

## 2017-03-24 NOTE — Telephone Encounter (Signed)
Today by phone [name] informed me she has finished chemotherapy early (as of 4-27).  Therefore I will move her simulation date. -----------------------------------  Gina Gibson, MD

## 2017-04-04 ENCOUNTER — Ambulatory Visit
Admission: RE | Admit: 2017-04-04 | Discharge: 2017-04-04 | Disposition: A | Discharge status: Home / Self Care | Payer: Managed Care, Other (non HMO) | Source: Ambulatory Visit | Attending: Radiation Oncology | Admitting: Radiation Oncology

## 2017-04-04 DIAGNOSIS — C50411 Malignant neoplasm of upper-outer quadrant of right female breast: Secondary | ICD-10-CM

## 2017-04-04 NOTE — Progress Notes (Signed)
  Radiation Oncology         (336) [phone] ________________________________  Name: Gina Dickson MRN: [id number]  Date: 04/04/2017  DOB: [birth year]  SIMULATION AND TREATMENT PLANNING NOTE    Outpatient  DIAGNOSIS:     ICD-9-CM ICD-10-CM   1. Primary cancer of upper outer quadrant of right female breast ([place]) 174.4 C50.411     NARRATIVE:  The patient was brought to the [place].  Identity was confirmed.  All relevant records and images related to the planned course of therapy were reviewed.  The patient freely provided informed written consent to proceed with treatment after reviewing the details related to the planned course of therapy. The consent form was witnessed and verified by the simulation staff.    Then, the patient was set-up in a stable reproducible supine position for radiation therapy with her ipsilateral arm over her head, and her upper body secured in a custom-made Vac-lok device.  CT images were obtained.  Surface markings were placed.  The CT images were loaded into the planning software.    TREATMENT PLANNING NOTE: Treatment planning then occurred.  The radiation prescription was entered and confirmed.     A total of 3 medically necessary complex treatment devices were fabricated and supervised by me: 2 fields with MLCs for custom blocks to protect heart, and lungs;  and, a Vac-lok. MORE COMPLEX DEVICES MAY BE MADE IN DOSIMETRY FOR FIELD IN FIELD BEAMS FOR DOSE HOMOGENEITY.  I have requested : 3D Simulation which is medically necessary to give adequate dose to at risk tissues while sparing lungs and heart.  I have requested a DVH of the following structures: lungs, heart, lumpectomy cavity.    The patient will receive 40.05 Gy in 15 fractions to the right breast with 2 tangential fields.  This will be followed by a boost.  Optical Surface Tracking Plan:  Since intensity modulated radiotherapy (IMRT) and 3D conformal radiation treatment methods are  predicated on accurate and precise positioning for treatment, intrafraction motion monitoring is medically necessary to ensure accurate and safe treatment delivery. The ability to quantify intrafraction motion without excessive ionizing radiation dose can only be performed with optical surface tracking. Accordingly, surface imaging offers the opportunity to obtain 3D measurements of patient position throughout IMRT and 3D treatments without excessive radiation exposure. I am ordering optical surface tracking for this patient's upcoming course of radiotherapy.  ________________________________   Reference:  Ursula Alert, J, et al. Surface imaging-based analysis of intrafraction motion for breast radiotherapy patients.Journal of [place], n. 6, nov. 2014. ISSN [id number].  Available at: <http://www.jacmp.org/index.php/jacmp/article/view/4957>.    -----------------------------------  Eppie Gibson, MD

## 2017-04-05 DIAGNOSIS — C50411 Malignant neoplasm of upper-outer quadrant of right female breast: Secondary | ICD-10-CM | POA: Diagnosis not present

## 2017-04-10 ENCOUNTER — Ambulatory Visit: Payer: Managed Care, Other (non HMO)

## 2017-04-10 ENCOUNTER — Inpatient Hospital Stay
Admission: RE | Admit: 2017-04-10 | Payer: Managed Care, Other (non HMO) | Source: Ambulatory Visit | Admitting: Radiation Oncology

## 2017-04-10 ENCOUNTER — Ambulatory Visit
Admission: RE | Admit: 2017-04-10 | Discharge: 2017-04-10 | Disposition: A | Discharge status: Home / Self Care | Payer: Managed Care, Other (non HMO) | Source: Ambulatory Visit | Attending: Radiation Oncology | Admitting: Radiation Oncology

## 2017-04-10 DIAGNOSIS — C50411 Malignant neoplasm of upper-outer quadrant of right female breast: Secondary | ICD-10-CM | POA: Diagnosis not present

## 2017-04-10 MED ORDER — ALRA NON-METALLIC DEODORANT (RAD-ONC)
1.0000 "application " | Freq: Once | TOPICAL | Status: AC
  Administered 2017-04-10: 1 via TOPICAL

## 2017-04-10 MED ORDER — BIAFINE EX EMUL
Freq: Two times a day (BID) | CUTANEOUS | Status: DC
  Administered 2017-04-10: 14:00:00 via TOPICAL

## 2017-04-10 NOTE — Progress Notes (Signed)
   Weekly Management Note:  outpatient    ICD-9-CM ICD-10-CM   1. Primary cancer of upper outer quadrant of right female breast (HCC) 174.4 P23.300 non-metallic deodorant (ALRA) 1 application     topical emolient (BIAFINE) emulsion    Current Dose:  2.67 Gy  Projected Dose: 50.05 Gy   Narrative:  The patient presents for routine under treatment assessment.  CBCT/MVCT images/Port film x-rays were reviewed.  The chart was checked. Doing well.   Physical Findings:  vitals were not taken for this visit.  Wt Readings from Last 3 Encounters:  04/10/17 171 lb 12.8 oz (77.9 kg)  03/15/17 175 lb 6.4 oz (79.6 kg)  11/16/16 169 lb 9.6 oz (76.9 kg)   No skin changes over right breast  Impression:  The patient is tolerating radiotherapy.  Plan:  Continue radiotherapy as planned. Patient instructed to apply Radiplex to intact skin in treatment fields.    ________________________________   Eppie Gibson, M.D.

## 2017-04-10 NOTE — Progress Notes (Signed)
Pt here for patient teaching.  Pt given Radiation and You booklet, skin care instructions, Alra deodorant and Sonafine.  Reviewed areas of pertinence such as fatigue, hair loss, skin changes, breast tenderness and breast swelling . Pt able to give teach back of to pat skin and use unscented/gentle soap,apply Sonafine bid, avoid applying anything to skin within 4 hours of treatment, avoid wearing an under wire bra and to use an electric razor if they must shave. Pt demonstrated understanding and verbalizes understanding of information given and will contact nursing with any questions or concerns.

## 2017-04-10 NOTE — Addendum Note (Signed)
Encounter addended by: Wilmon Arms, RN on: 04/10/2017  2:20 PM<BR>    Actions taken: Sign clinical note

## 2017-04-10 NOTE — Progress Notes (Signed)
Gina Dickson completed 1st fraction of radiation to right breast today.  Patient denies having any pain, no changes in appetite.  Patient states her energy level has improved since completing chemotherapy.  Patient has no complaints.    Vitals:   04/10/17 1325  BP: 119/81  Pulse: (!) 103  Resp: 20  Temp: 98.4 F (36.9 C)  TempSrc: Oral  SpO2: 99%  Weight: 171 lb 12.8 oz (77.9 kg)   Wt Readings from Last 3 Encounters:  04/10/17 171 lb 12.8 oz (77.9 kg)  03/15/17 175 lb 6.4 oz (79.6 kg)  11/16/16 169 lb 9.6 oz (76.9 kg)

## 2017-04-11 ENCOUNTER — Ambulatory Visit: Payer: Managed Care, Other (non HMO) | Admitting: Radiation Oncology

## 2017-04-11 ENCOUNTER — Ambulatory Visit
Admission: RE | Admit: 2017-04-11 | Discharge: 2017-04-11 | Disposition: A | Discharge status: Home / Self Care | Payer: Managed Care, Other (non HMO) | Source: Ambulatory Visit | Attending: Radiation Oncology | Admitting: Radiation Oncology

## 2017-04-11 DIAGNOSIS — C50411 Malignant neoplasm of upper-outer quadrant of right female breast: Secondary | ICD-10-CM | POA: Diagnosis not present

## 2017-04-12 ENCOUNTER — Ambulatory Visit: Payer: Managed Care, Other (non HMO)

## 2017-04-12 ENCOUNTER — Ambulatory Visit
Admission: RE | Admit: 2017-04-12 | Discharge: 2017-04-12 | Disposition: A | Discharge status: Home / Self Care | Payer: Managed Care, Other (non HMO) | Source: Ambulatory Visit | Attending: Radiation Oncology | Admitting: Radiation Oncology

## 2017-04-12 DIAGNOSIS — C50411 Malignant neoplasm of upper-outer quadrant of right female breast: Secondary | ICD-10-CM | POA: Diagnosis not present

## 2017-04-13 ENCOUNTER — Ambulatory Visit: Payer: Managed Care, Other (non HMO)

## 2017-04-13 ENCOUNTER — Ambulatory Visit
Admission: RE | Admit: 2017-04-13 | Discharge: 2017-04-13 | Disposition: A | Discharge status: Home / Self Care | Payer: Managed Care, Other (non HMO) | Source: Ambulatory Visit | Attending: Radiation Oncology | Admitting: Radiation Oncology

## 2017-04-13 DIAGNOSIS — C50411 Malignant neoplasm of upper-outer quadrant of right female breast: Secondary | ICD-10-CM | POA: Diagnosis not present

## 2017-04-14 ENCOUNTER — Ambulatory Visit: Payer: Managed Care, Other (non HMO)

## 2017-04-14 ENCOUNTER — Ambulatory Visit
Admission: RE | Admit: 2017-04-14 | Discharge: 2017-04-14 | Disposition: A | Discharge status: Home / Self Care | Payer: Managed Care, Other (non HMO) | Source: Ambulatory Visit | Attending: Radiation Oncology | Admitting: Radiation Oncology

## 2017-04-14 DIAGNOSIS — C50411 Malignant neoplasm of upper-outer quadrant of right female breast: Secondary | ICD-10-CM | POA: Diagnosis not present

## 2017-04-18 ENCOUNTER — Encounter: Payer: Self-pay | Admitting: Radiation Oncology

## 2017-04-18 ENCOUNTER — Ambulatory Visit
Admission: RE | Admit: 2017-04-18 | Discharge: 2017-04-18 | Disposition: A | Discharge status: Home / Self Care | Payer: Managed Care, Other (non HMO) | Source: Ambulatory Visit | Attending: Radiation Oncology | Admitting: Radiation Oncology

## 2017-04-18 ENCOUNTER — Ambulatory Visit: Payer: Managed Care, Other (non HMO)

## 2017-04-18 VITALS — BP 102/75 | HR 92 | Temp 98.2°F | Ht 65.0 in | Wt 170.0 lb

## 2017-04-18 DIAGNOSIS — C50411 Malignant neoplasm of upper-outer quadrant of right female breast: Secondary | ICD-10-CM

## 2017-04-18 NOTE — Progress Notes (Signed)
   Weekly Management Note:  outpatient    ICD-9-CM ICD-10-CM   1. Primary cancer of upper outer quadrant of right female breast (HCC) 174.4 C50.411     Current Dose:  16.02 Gy  Projected Dose: 50.05 Gy   Narrative:  The patient presents for routine under treatment assessment.  CBCT/MVCT images/Port film x-rays were reviewed.  The chart was checked.   The patient presents for her 6th fraction today. She denies pain or fatigue. She reports she is using Sonafine twice daily as directed. She reports hardened areas to her right breast which she would like to have assessed today.   Physical Findings:  height is 5\' 5"  (1.651 m) and weight is 170 lb (77.1 kg). Her temperature is 98.2 F (36.8 C). Her blood pressure is 102/75 and her pulse is 92. Her oxygen saturation is 100%.   Wt Readings from Last 3 Encounters:  04/18/17 170 lb (77.1 kg)  04/10/17 171 lb 12.8 oz (77.9 kg)  03/15/17 175 lb 6.4 oz (79.6 kg)   Right Breast is mildly erythematous. I palpated the right areola, lateral aspect, which has some subcutaneous firmness consistent with prior lumpectomy.  Impression:  The patient is tolerating radiotherapy.  Plan:  Continue radiotherapy as planned. Patient instructed to continue to use Sonafine.   ________________________________   Eppie Gibson, M.D.   This document serves as a record of services personally performed by Eppie Gibson, MD. It was created on her behalf by Maryla Morrow, a trained medical scribe. The creation of this record is based on the scribe's personal observations and the provider's statements to them. This document has been checked and approved by the attending provider.

## 2017-04-18 NOTE — Progress Notes (Signed)
Ms. Gina Dickson presents for her 6th fraction of radiation to her Right Breast. She denies pain or fatigue. Her Right Breast is slightly red especially at the incision site and around her nipple. She is using the sonafine twice daily. She has noted hardened areas to her Right Breast which she would like to have assessed today.  BP 102/75   Pulse 92   Temp 98.2 F (36.8 C)   Ht 5\' 5"  (1.651 m)   Wt 170 lb (77.1 kg)   SpO2 100% Comment: room air  BMI 28.29 kg/m    Wt Readings from Last 3 Encounters:  04/18/17 170 lb (77.1 kg)  04/10/17 171 lb 12.8 oz (77.9 kg)  03/15/17 175 lb 6.4 oz (79.6 kg)

## 2017-04-19 ENCOUNTER — Ambulatory Visit
Admission: RE | Admit: 2017-04-19 | Discharge: 2017-04-19 | Disposition: A | Discharge status: Home / Self Care | Payer: Managed Care, Other (non HMO) | Source: Ambulatory Visit | Attending: Radiation Oncology | Admitting: Radiation Oncology

## 2017-04-19 DIAGNOSIS — C50411 Malignant neoplasm of upper-outer quadrant of right female breast: Secondary | ICD-10-CM | POA: Diagnosis not present

## 2017-04-20 ENCOUNTER — Ambulatory Visit
Admission: RE | Admit: 2017-04-20 | Discharge: 2017-04-20 | Disposition: A | Discharge status: Home / Self Care | Payer: Managed Care, Other (non HMO) | Source: Ambulatory Visit | Attending: Radiation Oncology | Admitting: Radiation Oncology

## 2017-04-20 DIAGNOSIS — C50411 Malignant neoplasm of upper-outer quadrant of right female breast: Secondary | ICD-10-CM | POA: Diagnosis not present

## 2017-04-21 ENCOUNTER — Ambulatory Visit
Admission: RE | Admit: 2017-04-21 | Discharge: 2017-04-21 | Disposition: A | Discharge status: Home / Self Care | Payer: Managed Care, Other (non HMO) | Source: Ambulatory Visit | Attending: Radiation Oncology | Admitting: Radiation Oncology

## 2017-04-21 DIAGNOSIS — C50411 Malignant neoplasm of upper-outer quadrant of right female breast: Secondary | ICD-10-CM | POA: Diagnosis not present

## 2017-04-24 ENCOUNTER — Ambulatory Visit
Admission: RE | Admit: 2017-04-24 | Discharge: 2017-04-24 | Disposition: A | Discharge status: Home / Self Care | Payer: Managed Care, Other (non HMO) | Source: Ambulatory Visit | Attending: Radiation Oncology | Admitting: Radiation Oncology

## 2017-04-24 ENCOUNTER — Encounter: Payer: Self-pay | Admitting: Radiation Oncology

## 2017-04-24 VITALS — BP 104/83 | HR 85 | Temp 98.9°F | Ht 65.0 in | Wt 168.0 lb

## 2017-04-24 DIAGNOSIS — C50411 Malignant neoplasm of upper-outer quadrant of right female breast: Secondary | ICD-10-CM | POA: Diagnosis not present

## 2017-04-24 NOTE — Progress Notes (Signed)
Gina Dickson presents for her 10th fraction of radiation to her Right Breast. She denies pain or fatigue. She has redness to her Right Breast. The incision area is irritated due to her sports bra. She has several raised bump areas to the upper inner quadrant of the Right Breast. She is using the Radiaplex twice daily.   BP 104/83   Pulse 85   Temp 98.9 F (37.2 C)   Ht 5\' 5"  (1.651 m)   Wt 168 lb (76.2 kg)   SpO2 100% Comment: room air  BMI 27.96 kg/m    Wt Readings from Last 3 Encounters:  04/24/17 168 lb (76.2 kg)  04/18/17 170 lb (77.1 kg)  04/10/17 171 lb 12.8 oz (77.9 kg)

## 2017-04-24 NOTE — Progress Notes (Signed)
   Weekly Management Note:  outpatient    ICD-9-CM ICD-10-CM   1. Primary cancer of upper outer quadrant of right female breast (HCC) 174.4 C50.411     Current Dose:  26.7 Gy  Projected Dose: 50.05 Gy   Narrative:  The patient presents for routine under treatment assessment.  CBCT/MVCT images/Port film x-rays were reviewed.  The chart was checked.   The patient presents for her 10th fraction today.  She denies pain or fatigue.  Some skin irritation in axilla from bra  Physical Findings:  height is 5\' 5"  (1.651 m) and weight is 168 lb (76.2 kg). Her temperature is 98.9 F (37.2 C). Her blood pressure is 104/83 and her pulse is 85. Her oxygen saturation is 100%.   Wt Readings from Last 3 Encounters:  04/24/17 168 lb (76.2 kg)  04/18/17 170 lb (77.1 kg)  04/10/17 171 lb 12.8 oz (77.9 kg)   Right Breast is mildly erythematous.   Impression:  The patient is tolerating radiotherapy.  Plan:  Continue radiotherapy as planned. Patient instructed to continue to use Sonafine. Apply maxi pad to bra if it is chaffing her skin in axilla.   ________________________________   Eppie Gibson, M.D.   This document serves as a record of services personally performed by Eppie Gibson, MD. It was created on her behalf by Maryla Morrow, a trained medical scribe. The creation of this record is based on the scribe's personal observations and the provider's statements to them. This document has been checked and approved by the attending provider.

## 2017-04-25 ENCOUNTER — Ambulatory Visit
Admission: RE | Admit: 2017-04-25 | Discharge: 2017-04-25 | Disposition: A | Discharge status: Home / Self Care | Payer: Managed Care, Other (non HMO) | Source: Ambulatory Visit | Attending: Radiation Oncology | Admitting: Radiation Oncology

## 2017-04-25 DIAGNOSIS — C50411 Malignant neoplasm of upper-outer quadrant of right female breast: Secondary | ICD-10-CM | POA: Diagnosis not present

## 2017-04-26 ENCOUNTER — Ambulatory Visit
Admission: RE | Admit: 2017-04-26 | Discharge: 2017-04-26 | Disposition: A | Discharge status: Home / Self Care | Payer: Managed Care, Other (non HMO) | Source: Ambulatory Visit | Attending: Radiation Oncology | Admitting: Radiation Oncology

## 2017-04-26 DIAGNOSIS — C50411 Malignant neoplasm of upper-outer quadrant of right female breast: Secondary | ICD-10-CM | POA: Diagnosis not present

## 2017-04-27 ENCOUNTER — Ambulatory Visit
Admission: RE | Admit: 2017-04-27 | Discharge: 2017-04-27 | Disposition: A | Discharge status: Home / Self Care | Payer: Managed Care, Other (non HMO) | Source: Ambulatory Visit | Attending: Radiation Oncology | Admitting: Radiation Oncology

## 2017-04-27 DIAGNOSIS — C50411 Malignant neoplasm of upper-outer quadrant of right female breast: Secondary | ICD-10-CM | POA: Diagnosis not present

## 2017-04-28 ENCOUNTER — Ambulatory Visit
Admission: RE | Admit: 2017-04-28 | Discharge: 2017-04-28 | Disposition: A | Discharge status: Home / Self Care | Payer: Managed Care, Other (non HMO) | Source: Ambulatory Visit | Attending: Radiation Oncology | Admitting: Radiation Oncology

## 2017-04-28 DIAGNOSIS — C50411 Malignant neoplasm of upper-outer quadrant of right female breast: Secondary | ICD-10-CM | POA: Diagnosis not present

## 2017-05-01 ENCOUNTER — Encounter: Payer: Self-pay | Admitting: Radiation Oncology

## 2017-05-01 ENCOUNTER — Ambulatory Visit
Admission: RE | Admit: 2017-05-01 | Discharge: 2017-05-01 | Disposition: A | Discharge status: Home / Self Care | Payer: Managed Care, Other (non HMO) | Source: Ambulatory Visit | Attending: Radiation Oncology | Admitting: Radiation Oncology

## 2017-05-01 VITALS — BP 103/67 | HR 88 | Temp 97.9°F | Wt 166.4 lb

## 2017-05-01 DIAGNOSIS — C50411 Malignant neoplasm of upper-outer quadrant of right female breast: Secondary | ICD-10-CM | POA: Diagnosis present

## 2017-05-01 DIAGNOSIS — Z79899 Other long term (current) drug therapy: Secondary | ICD-10-CM | POA: Insufficient documentation

## 2017-05-01 DIAGNOSIS — Z170 Estrogen receptor positive status [ER+]: Secondary | ICD-10-CM | POA: Insufficient documentation

## 2017-05-01 DIAGNOSIS — Z9889 Other specified postprocedural states: Secondary | ICD-10-CM | POA: Insufficient documentation

## 2017-05-01 MED ORDER — LORAZEPAM 1 MG PO TABS
ORAL_TABLET | ORAL | 0 refills | Status: AC
Start: 2017-05-01 — End: ?

## 2017-05-01 NOTE — Progress Notes (Signed)
Ms. Gina Dickson presents for her 15th fraction of radiation to her Right Breast. She denies pain. She has some mild fatigue. She continues to exercise and is running. She has redness to her Right Breast. She has some raised red bumps to the inner area of her Right Breast. She is using Biafine twice daily.  BP 103/67   Pulse 88   Temp 97.9 F (36.6 C)   Wt 166 lb 6.4 oz (75.5 kg)   SpO2 100% Comment: room air  BMI 27.69 kg/m    Wt Readings from Last 3 Encounters:  05/01/17 166 lb 6.4 oz (75.5 kg)  04/24/17 168 lb (76.2 kg)  04/18/17 170 lb (77.1 kg)

## 2017-05-01 NOTE — Progress Notes (Signed)
   Weekly Management Note:  outpatient    ICD-10-CM   1. 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  CBCT/MVCT images/Port film x-rays were reviewed.  The chart was checked.   The patient presents for her 15th fraction today. Doing well. Running almost daily.  Physical Findings:  weight is 166 lb 6.4 oz (75.5 kg). Her temperature is 97.9 F (36.6 C). Her blood pressure is 103/67 and her pulse is 88. Her oxygen saturation is 100%.   Wt Readings from Last 3 Encounters:  05/01/17 166 lb 6.4 oz (75.5 kg)  04/24/17 168 lb (76.2 kg)  04/18/17 170 lb (77.1 kg)   Right Breast is moderately erythematous.   Impression:  The patient is tolerating radiotherapy.  Plan:  Continue radiotherapy as planned. Patient instructed to continue to use Sonafine.   ________________________________   Eppie Gibson, M.D.

## 2017-05-02 ENCOUNTER — Ambulatory Visit
Admission: RE | Admit: 2017-05-02 | Discharge: 2017-05-02 | Disposition: A | Discharge status: Home / Self Care | Payer: Managed Care, Other (non HMO) | Source: Ambulatory Visit | Attending: Radiation Oncology | Admitting: Radiation Oncology

## 2017-05-02 DIAGNOSIS — C50411 Malignant neoplasm of upper-outer quadrant of right female breast: Secondary | ICD-10-CM | POA: Diagnosis not present

## 2017-05-03 ENCOUNTER — Ambulatory Visit
Admission: RE | Admit: 2017-05-03 | Discharge: 2017-05-03 | Disposition: A | Discharge status: Home / Self Care | Payer: Managed Care, Other (non HMO) | Source: Ambulatory Visit | Attending: Radiation Oncology | Admitting: Radiation Oncology

## 2017-05-03 DIAGNOSIS — C50411 Malignant neoplasm of upper-outer quadrant of right female breast: Secondary | ICD-10-CM | POA: Diagnosis not present

## 2017-05-04 ENCOUNTER — Ambulatory Visit
Admission: RE | Admit: 2017-05-04 | Discharge: 2017-05-04 | Disposition: A | Discharge status: Home / Self Care | Payer: Managed Care, Other (non HMO) | Source: Ambulatory Visit | Attending: Radiation Oncology | Admitting: Radiation Oncology

## 2017-05-04 DIAGNOSIS — C50411 Malignant neoplasm of upper-outer quadrant of right female breast: Secondary | ICD-10-CM | POA: Diagnosis not present

## 2017-05-05 ENCOUNTER — Ambulatory Visit
Admission: RE | Admit: 2017-05-05 | Discharge: 2017-05-05 | Disposition: A | Discharge status: Home / Self Care | Payer: Managed Care, Other (non HMO) | Source: Ambulatory Visit | Attending: Radiation Oncology | Admitting: Radiation Oncology

## 2017-05-05 DIAGNOSIS — C50411 Malignant neoplasm of upper-outer quadrant of right female breast: Secondary | ICD-10-CM | POA: Diagnosis not present

## 2017-05-08 ENCOUNTER — Ambulatory Visit
Admission: RE | Admit: 2017-05-08 | Discharge: 2017-05-08 | Disposition: A | Discharge status: Home / Self Care | Payer: Managed Care, Other (non HMO) | Source: Ambulatory Visit | Attending: Radiation Oncology | Admitting: Radiation Oncology

## 2017-05-08 ENCOUNTER — Ambulatory Visit: Payer: Managed Care, Other (non HMO)

## 2017-05-08 ENCOUNTER — Encounter: Payer: Self-pay | Admitting: Radiation Oncology

## 2017-05-08 VITALS — BP 121/80 | HR 79 | Temp 97.8°F | Wt 167.2 lb

## 2017-05-08 DIAGNOSIS — C50411 Malignant neoplasm of upper-outer quadrant of right female breast: Secondary | ICD-10-CM | POA: Diagnosis not present

## 2017-05-08 NOTE — Progress Notes (Signed)
Ms. Gina Dickson is here for her last fraction of radiation to her RIght Breast. She denies pain. She has some mild fatigue. She had her PAC removed on Thursday and had some pain for several days afterwards, but feels better today. She is unable to get a follow up appointment for several months due to her and Dr. Pearlie Oyster vacation coming up. She has redness to her Right Breast, and Right Axilla area. She does have dermatitis to the upper inner quadrant of her RIght Breast. She has itching to this area, and knows that she can use hydrocortisone cream. She is using biafine and will switch to vitamin E cream when completed.   BP 121/80   Pulse 79   Temp 97.8 F (36.6 C)   Wt 167 lb 3.2 oz (75.8 kg)   SpO2 100% Comment: room air  BMI 27.82 kg/m    Wt Readings from Last 3 Encounters:  05/08/17 167 lb 3.2 oz (75.8 kg)  05/01/17 166 lb 6.4 oz (75.5 kg)  04/24/17 168 lb (76.2 kg)

## 2017-05-08 NOTE — Progress Notes (Addendum)
   Weekly Management Note:  outpatient    ICD-10-CM   1. Primary cancer of upper outer quadrant of right female breast ([place]) C50.411    Current Dose:  50.05 Gy  Projected Dose: 50.05 Gy   Narrative:  The patient presents for routine under treatment assessment.  CBCT/MVCT images/Port film x-rays were reviewed.  The chart was checked.    Doing well.    Physical Findings:  weight is 167 lb 3.2 oz (75.8 kg). Her temperature is 97.8 F (36.6 C). Her blood pressure is 121/80 and her pulse is 79. Her oxygen saturation is 100%.   Wt Readings from Last 3 Encounters:  05/08/17 167 lb 3.2 oz (75.8 kg)  05/01/17 166 lb 6.4 oz (75.5 kg)  04/24/17 168 lb (76.2 kg)   Right Breast is moderately erythematous.   Impression:  The patient is tolerating radiotherapy.  Plan:  F/u in 2-3 mo as vacation allows. Patient instructed to continue to use Sonafine, then Vit E lotion.  Sun hygiene discussed..  hydrocortisone 1% cream prn itching.  ________________________________   Eppie Gibson, M.D.

## 2017-05-08 NOTE — Addendum Note (Signed)
Encounter addended by: Eppie Gibson, MD on: 05/08/2017  6:36 PM<BR>    Actions taken: Sign clinical note

## 2017-05-09 ENCOUNTER — Ambulatory Visit: Payer: Managed Care, Other (non HMO)

## 2017-05-09 NOTE — Progress Notes (Incomplete)
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 Post operative and post-adjuvant chemotherapy to reduce the risk of recurrence  Radiation treatment dates:  04/10/17 - 05/08/17  Site/dose:    1) Right Breast: 40.05 Gy in 15 fractions 2) Right Breast Boost: 10 Gy in 5 fractions  Beams/energy:    1) 3D // 10X, 6X Photon 2) Special Teletherapy // 12E Electron  Narrative: The patient tolerated radiation treatment relatively well. Her right breast became moderately erythematous at the end of treatment. Her breast became itchy and she was instructed to use hydrocortisone cream to soothe it.  Plan: The patient has completed radiation treatment. The patient will return to radiation oncology clinic for routine followup in one month. I advised them to call or return sooner if they have any questions or concerns related to their recovery or treatment.  -----------------------------------  Eppie Gibson, MD  This document serves as a record of services personally performed by Eppie Gibson, MD. It was created on her behalf by Darcus Austin, a trained medical scribe. The creation of this record is based on the scribe's personal observations and the provider's statements to them. This document has been checked and approved by the attending provider.

## 2017-05-10 ENCOUNTER — Ambulatory Visit: Payer: Managed Care, Other (non HMO)

## 2017-05-15 ENCOUNTER — Ambulatory Visit
Admission: RE | Admit: 2017-05-15 | Payer: Managed Care, Other (non HMO) | Source: Ambulatory Visit | Admitting: Radiation Oncology

## 2017-05-26 ENCOUNTER — Other Ambulatory Visit: Payer: Self-pay | Admitting: Obstetrics and Gynecology

## 2017-05-26 DIAGNOSIS — Z853 Personal history of malignant neoplasm of breast: Secondary | ICD-10-CM

## 2017-06-05 ENCOUNTER — Telehealth: Payer: Self-pay | Admitting: *Deleted

## 2017-06-05 NOTE — Telephone Encounter (Signed)
CALLED PATIENT TO INFORM OF FU APPT. ON 07-14-17 @ 4 PM WITH DR. Isidore Moos, LVM FOR A RETURN CALL

## 2017-06-16 ENCOUNTER — Ambulatory Visit: Payer: Self-pay | Admitting: Radiation Oncology

## 2017-07-14 ENCOUNTER — Ambulatory Visit
Admission: RE | Admit: 2017-07-14 | Discharge: 2017-07-14 | Disposition: A | Discharge status: Home / Self Care | Payer: Managed Care, Other (non HMO) | Source: Ambulatory Visit | Attending: Radiation Oncology | Admitting: Radiation Oncology

## 2017-07-14 ENCOUNTER — Encounter: Payer: Self-pay | Admitting: Radiation Oncology

## 2017-07-14 DIAGNOSIS — Z888 Allergy status to other drugs, medicaments and biological substances status: Secondary | ICD-10-CM | POA: Insufficient documentation

## 2017-07-14 DIAGNOSIS — Z885 Allergy status to narcotic agent status: Secondary | ICD-10-CM | POA: Insufficient documentation

## 2017-07-14 DIAGNOSIS — Z923 Personal history of irradiation: Secondary | ICD-10-CM | POA: Insufficient documentation

## 2017-07-14 DIAGNOSIS — Z79891 Long term (current) use of opiate analgesic: Secondary | ICD-10-CM | POA: Diagnosis not present

## 2017-07-14 DIAGNOSIS — C50411 Malignant neoplasm of upper-outer quadrant of right female breast: Secondary | ICD-10-CM

## 2017-07-14 DIAGNOSIS — Z791 Long term (current) use of non-steroidal anti-inflammatories (NSAID): Secondary | ICD-10-CM | POA: Insufficient documentation

## 2017-07-14 DIAGNOSIS — Z79899 Other long term (current) drug therapy: Secondary | ICD-10-CM | POA: Insufficient documentation

## 2017-07-14 DIAGNOSIS — Z91048 Other nonmedicinal substance allergy status: Secondary | ICD-10-CM | POA: Diagnosis not present

## 2017-07-14 NOTE — Progress Notes (Signed)
Gina Dickson presents for follow up of radiation completed to her Right Breast 05/08/17. She denies pain or fatigue. She reports her skin has healed well. She is using her regular lotion at this time. She is taking Tamoxifen. She is seeing Dr. Georgiann Cocker for medical oncology 11/15/17.   BP 99/68   Pulse 78   Temp 98.2 F (36.8 C)   Wt 159 lb (72.1 kg)   SpO2 100% Comment: room air  BMI 26.46 kg/m    Wt Readings from Last 3 Encounters:  07/14/17 159 lb (72.1 kg)  05/08/17 167 lb 3.2 oz (75.8 kg)  05/01/17 166 lb 6.4 oz (75.5 kg)

## 2017-07-14 NOTE — Progress Notes (Signed)
Radiation Oncology         [phone] ________________________________  Name: Gina Dickson MRN: [id number]  Date: 07/14/2017  DOB: 07/26/1966  Follow-Up Visit Note  Outpatient  CC: Gina Gravel, MD  Gina Skates, MD  Diagnosis:      ICD-10-CM   1. 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  She is doing well and denies pain or fatigue. She denies any new palpable lumps or bumps in her breasts and says her skin has healed well from treatment. She continues to be followed by medical oncology and is taking Tamoxifen. No additional complaints today. She reports exercise 5x per week. She will see Dr. Georgiann Cocker for medical oncology on 11/15/2017.                             ALLERGIES:  is allergic to adhesive [tape]; alcohol [isopropyl alcohol]; erythromycin; morphine and related; other; and morphine.  Meds: Current Outpatient Prescriptions  Medication Sig Dispense Refill  . CALCIUM-MAGNESIUM PO Take 1 tablet by mouth daily.    . cetirizine (ZYRTEC) 10 MG tablet Take 10 mg by mouth every evening.    Marland Kitchen ibuprofen (ADVIL,MOTRIN) 200 MG tablet Take 200-400 mg by mouth 2 (two) times daily as needed for headache or moderate pain.     . tamoxifen (NOLVADEX) 20 MG tablet Take 20 mg by mouth at bedtime.     Marland Kitchen HYDROcodone-acetaminophen (NORCO) 5-325 MG tablet Take 1-2 tablets by mouth every 6 (six) hours as needed for moderate pain or severe pain. (Patient not taking: Reported on 03/15/2017) 30 tablet 0  . LORazepam (ATIVAN) 1 MG tablet Take 1 tablet 20-30 min before port a cath removal. (Patient not taking: Reported on 07/14/2017) 1 tablet 0  . Multiple  Vitamins-Minerals (THERA-M) TABS Take by mouth.    . Omega-3 Fatty Acids (FISH OIL PO) Take 1 capsule by mouth daily.     No current facility-administered medications for this encounter.     Physical Findings: Blood pressure 99/68, pulse 78, temperature 98.2 F (36.8 C), weight 159 lb (72.1 kg), SpO2 100 %.  General: Alert and oriented, in no acute distress Breast Exam: The skin is essentially completely healed, slightly pigmented in the right breast.  Lab Findings: Lab Results  Component Value Date   WBC 9.8 11/22/2016   HGB 13.4 11/22/2016   HCT 40.1 11/22/2016   MCV 91.8 11/22/2016   PLT 300 11/22/2016    Radiographic Findings: No results found.   Impression/Plan: This is a very pleasant woman with a history of right breast cancer.  She knows continue yearly mammography. She currently has one scheduled for 09/13/2017. I will see her back as needed. Continue routine follow up in medical oncology.  I encouraged her to call if she has any issues in the interim.   I spent 20 minutes face to face with the patient and more than 50% of that time was spent in counseling and/or coordination of care.  _____________________________________   Eppie Gibson, MD  This document serves as a record of services personally performed by Eppie Gibson, MD. It was created on her behalf by  Rae Lips, a trained medical scribe. The creation of this record is based on the scribe's personal observations and the provider's statements to them. This document has been checked and approved by the attending provider.

## 2017-08-24 ENCOUNTER — Other Ambulatory Visit: Payer: Self-pay | Admitting: Orthopedic Surgery

## 2017-08-24 DIAGNOSIS — M7542 Impingement syndrome of left shoulder: Secondary | ICD-10-CM

## 2017-09-02 ENCOUNTER — Ambulatory Visit
Admission: RE | Admit: 2017-09-02 | Discharge: 2017-09-02 | Disposition: A | Discharge status: Home / Self Care | Payer: Managed Care, Other (non HMO) | Source: Ambulatory Visit | Attending: Orthopedic Surgery | Admitting: Orthopedic Surgery

## 2017-09-02 DIAGNOSIS — M7542 Impingement syndrome of left shoulder: Secondary | ICD-10-CM

## 2017-09-05 ENCOUNTER — Ambulatory Visit (INDEPENDENT_AMBULATORY_CARE_PROVIDER_SITE_OTHER): Payer: Managed Care, Other (non HMO) | Admitting: Allergy and Immunology

## 2017-09-05 ENCOUNTER — Encounter: Payer: Self-pay | Admitting: Allergy and Immunology

## 2017-09-05 VITALS — BP 100/72 | HR 64 | Resp 14 | Ht 64.5 in | Wt 158.4 lb

## 2017-09-05 DIAGNOSIS — J3089 Other allergic rhinitis: Secondary | ICD-10-CM | POA: Diagnosis not present

## 2017-09-05 DIAGNOSIS — L253 Unspecified contact dermatitis due to other chemical products: Secondary | ICD-10-CM | POA: Insufficient documentation

## 2017-09-05 MED ORDER — AZELASTINE HCL 0.15 % NA SOLN: 2.0000 | Freq: Two times a day (BID) | NASAL | 5 refills | Status: AC | PRN

## 2017-09-05 MED ORDER — CARBINOXAMINE MALEATE 6 MG PO TABS: 6.0000 mg | ORAL_TABLET | Freq: Three times a day (TID) | ORAL | 5 refills | Status: AC | PRN

## 2017-09-05 NOTE — Assessment & Plan Note (Signed)
   Aeroallergen avoidance measures have been discussed and provided in written form.  A prescription has been provided for RyVent (carbinoxamine maleate) 6mg  every 6-8 hours as needed.  A prescription has been provided for azelastine nasal spray, 1-2 sprays per nostril 2 times daily as needed. Proper nasal spray technique has been discussed and demonstrated.   I have also recommended nasal saline spray (i.e., Simply Saline) or nasal saline lavage (i.e., NeilMed) as needed and prior to medicated nasal sprays.

## 2017-09-05 NOTE — Assessment & Plan Note (Signed)
The patient's history suggests contact dermatitis. We will proceed with patch testing.  She will return next Monday forT.R.U.E. patch test panel placement.  For now, continue avoidance of Chapstick and lipsticks.

## 2017-09-05 NOTE — Patient Instructions (Addendum)
Contact dermatitis due to chemicals The patient's history suggests contact dermatitis. We will proceed with patch testing.  She will return next Monday forT.R.U.E. patch test panel placement.  For now, continue avoidance of Chapstick and lipsticks.  Allergic rhinitis with a probable nonallergic component  Aeroallergen avoidance measures have been discussed and provided in written form.  A prescription has been provided for RyVent (carbinoxamine maleate) 6mg  every 6-8 hours as needed.  A prescription has been provided for azelastine nasal spray, 1-2 sprays per nostril 2 times daily as needed. Proper nasal spray technique has been discussed and demonstrated.   I have also recommended nasal saline spray (i.e., Simply Saline) or nasal saline lavage (i.e., NeilMed) as needed and prior to medicated nasal sprays.   Return in about 6 days (around 09/11/2017) for patch placement.  Reducing Pollen Exposure  The American Academy of Allergy, Asthma and Immunology suggests the following steps to reduce your exposure to pollen during allergy seasons.    1. Do not hang sheets or clothing out to dry; pollen may collect on these items. 2. Do not mow lawns or spend time around freshly cut grass; mowing stirs up pollen. 3. Keep windows closed at night.  Keep car windows closed while driving. 4. Minimize morning activities outdoors, a time when pollen counts are usually at their highest. 5. Stay indoors as much as possible when pollen counts or humidity is high and on windy days when pollen tends to remain in the air longer. 6. Use air conditioning when possible.  Many air conditioners have filters that trap the pollen spores. 7. Use a HEPA room air filter to remove pollen form the indoor air you breathe.   Control of House Dust Mite Allergen  House dust mites play a major role in allergic asthma and rhinitis.  They occur in environments with high humidity wherever human skin, the food for dust mites  is found. High levels have been detected in dust obtained from mattresses, pillows, carpets, upholstered furniture, bed covers, clothes and soft toys.  The principal allergen of the house dust mite is found in its feces.  A gram of dust may contain 1,000 mites and 250,000 fecal particles.  Mite antigen is easily measured in the air during house cleaning activities.    1. Encase mattresses, including the box spring, and pillow, in an air tight cover.  Seal the zipper end of the encased mattresses with wide adhesive tape. 2. Wash the bedding in water of 130 degrees Farenheit weekly.  Avoid cotton comforters/quilts and flannel bedding: the most ideal bed covering is the dacron comforter. 3. Remove all upholstered furniture from the bedroom. 4. Remove carpets, carpet padding, rugs, and non-washable window drapes from the bedroom.  Wash drapes weekly or use plastic window coverings. 5. Remove all non-washable stuffed toys from the bedroom.  Wash stuffed toys weekly. 6. Have the room cleaned frequently with a vacuum cleaner and a damp dust-mop.  The patient should not be in a room which is being cleaned and should wait 1 hour after cleaning before going into the room. 7. Close and seal all heating outlets in the bedroom.  Otherwise, the room will become filled with dust-laden air.  An electric heater can be used to heat the room. 8. Reduce indoor humidity to less than 50%.  Do not use a humidifier.  Control of Dog or Cat Allergen  Avoidance is the best way to manage a dog or cat allergy. If you have a dog or cat  and are allergic to dog or cats, consider removing the dog or cat from the home. If you have a dog or cat but don't want to find it a new home, or if your family wants a pet even though someone in the household is allergic, here are some strategies that may help keep symptoms at bay:  1. Keep the pet out of your bedroom and restrict it to only a few rooms. Be advised that keeping the dog or cat in  only one room will not limit the allergens to that room. 2. Don't pet, hug or kiss the dog or cat; if you do, wash your hands with soap and water. 3. High-efficiency particulate air (HEPA) cleaners run continuously in a bedroom or living room can reduce allergen levels over time. 4. Place electrostatic material sheet in the air inlet vent in the bedroom. 5. Regular use of a high-efficiency vacuum cleaner or a central vacuum can reduce allergen levels. 6. Giving your dog or cat a bath at least once a week can reduce airborne allergen.

## 2017-09-05 NOTE — Progress Notes (Signed)
New Patient Note  RE: Gina Dickson MRN: [id number] DOB: 08/14/1966 Date of Office Visit: 09/05/2017  Referring provider: [name], MD Primary care provider: [name], MD  Chief Complaint: Other (lip burning and swelling) and Allergic Rhinitis    History of present illness: Gina Dickson is a 51 y.o. female seen today in consultation requested by [name], MD.  She reports that over the past 2 years she has experienced episodes of burning and swelling of her lips. She associates these episodes with the use of Chapstick and most lipsticks. The burning sensation and swelling last for approximately 2 hours.  She does not experience concomitant hives, cardiopulmonary symptoms, or other GI symptoms.  She states that the only product that she is able to use on her lips that she is certain will not cause symptoms is Vaseline petrolatum.  She was seen by a dermatologist and advised to avoid Chapstick and lipsticks.    Yuli states that over the past 2 years she has experienced sinus pressure, thick postnasal drainage, and irritated throat.  These symptoms occur year around but tend to be more frequent and severe during the spring and summer.  She attempts to control these symptoms with cetirizine daily.  She noted a markedly increased in symptoms over the past 3 days while off of cetirizine in anticipation of today's appointment.   Assessment and plan: Contact dermatitis due to chemicals The patient's history suggests contact dermatitis. We will proceed with patch testing.  She will return next Monday forT.R.U.E. patch test panel placement.  For now, continue avoidance of Chapstick and lipsticks.  Allergic rhinitis with a probable nonallergic component  Aeroallergen avoidance measures have been discussed and provided in written form.  A prescription has been provided for RyVent (carbinoxamine maleate) 6mg  every 6-8 hours as needed.  A prescription has been provided for azelastine  nasal spray, 1-2 sprays per nostril 2 times daily as needed. Proper nasal spray technique has been discussed and demonstrated.   I have also recommended nasal saline spray (i.e., Simply Saline) or nasal saline lavage (i.e., NeilMed) as needed and prior to medicated nasal sprays.   Meds ordered this encounter  Medications  . Carbinoxamine Maleate (RYVENT) 6 MG TABS    Sig: Take 6 mg by mouth every 8 (eight) hours as needed. Take 6mg  every 6-8 hours as needed    Dispense:  120 tablet    Refill:  5  . Azelastine HCl 0.15 % SOLN    Sig: Place 2 sprays into the nose 2 (two) times daily as needed.    Dispense:  30 mL    Refill:  5    Diagnostics: Environmental skin testing: Positive to tree pollen and grass pollen.   Physical examination: Blood pressure 100/72, pulse 64, resp. rate 14, height 5' 4.5" (1.638 m), weight 158 lb 6.4 oz (71.8 kg).  General: Alert, interactive, in no acute distress. HEENT: TMs pearly gray, turbinates moderately edematous without discharge, post-pharynx unremarkable. Neck: Supple without lymphadenopathy. Lungs: Clear to auscultation without wheezing, rhonchi or rales. CV: Normal S1, S2 without murmurs. Abdomen: Nondistended, nontender. Skin: Warm and dry, without lesions or rashes. Extremities:  No clubbing, cyanosis or edema. Neuro:   Grossly intact.  Review of systems:  Review of systems negative except as noted in HPI / PMHx or noted below: Review of Systems  Constitutional: Negative.   HENT: Negative.   Eyes: Negative.   Respiratory: Negative.   Cardiovascular: Negative.   Gastrointestinal: Negative.   Genitourinary:  Negative.   Musculoskeletal: Negative.   Skin: Negative.   Neurological: Negative.   Endo/Heme/Allergies: Negative.   Psychiatric/Behavioral: Negative.     Past medical history:  Past Medical History:  Diagnosis Date  . Anemia    hx  . Anxiety    Panic attack  x 1 - no medication  . Cancer ([facility])    breast  . Colitis     . Complication of anesthesia   . PONV (postoperative nausea and vomiting)    last surgery arthroscopy no problem with nausea- please use same thing- No problem after Lumpectomy with N?V- Slow to awaken always  . Primary cancer of upper outer quadrant of right female breast ([place]) 10/06/2016  . PTSD (post-traumatic stress disorder)    was seen  by therapist  . Ulcerative colitis [facility])     Past surgical history:  Past Surgical History:  Procedure Laterality Date  . ANTERIOR CRUCIATE LIGAMENT REPAIR  2007   right  . BREAST LUMPECTOMY WITH RADIOACTIVE SEED AND SENTINEL LYMPH NODE BIOPSY Right 10/06/2016   Procedure: BREAST LUMPECTOMY WITH RADIOACTIVE SEED AND SENTINEL LYMPH NODE BIOPSY;  Surgeon: [name], MD;  Location: [facility];  Service: General;  Laterality: Right;  . BREAST REDUCTION SURGERY Bilateral 2004  . CESAREAN SECTION    . COLONOSCOPY    . KNEE ARTHROSCOPY WITH MEDIAL MENISECTOMY Right 06/09/2014   Procedure: KNEE ARTHROSCOPY WITH PARTIAL MEDIAL MENISECTOMY, CHONDROPLASTY;  Surgeon: Hessie Dibble, MD;  Location: [facility];  Service: Orthopedics;  Laterality: Right;  . PARTIAL COLECTOMY  2000   ulcerative  . PORTACATH PLACEMENT N/A 11/22/2016   Procedure: INSERTION PORT-A-CATH WITH [place];  Surgeon: [name], MD;  Location: [facility];  Service: General;  Laterality: N/A;  . WISDOM TOOTH EXTRACTION      Family history: Family History  Problem Relation Age of Onset  . Asthma Mother   . Allergic rhinitis Neg Hx   . Angioedema Neg Hx   . Eczema Neg Hx   . Immunodeficiency Neg Hx   . Urticaria Neg Hx     Social history: Social History   Social History  . Marital status: Legally Separated    Spouse name: N/A  . Number of children: N/A  . Years of education: N/A   Occupational History  . Not on file.   Social History Main Topics  . Smoking status: Never Smoker  . Smokeless tobacco: Never Used  . Alcohol use 0.6 oz/week    1 Glasses of wine per  week  . Drug use: No  . Sexual activity: Not on file   Other Topics Concern  . Not on file   Social History Narrative  . No narrative on file   Environmental History: The patient lives in a 49 year old house carpeting throughout, gas heat, and central air.  There is a dog in house which has access to her bedroom.  There is no known mold/water damage in the home.  She is a nonsmoker.  Allergies as of 09/05/2017      Reactions   Adhesive [tape] Hives, Itching, Swelling   Alcohol [isopropyl Alcohol] Hives, Itching   WHEN PT GETS SHOTS- WIPE WITH RUBBING ALCOHOL, THEN WIPE THE RUBBING ALCOHOL OFF OF HER SKIN PRIOR TO ADMINISTERING THE SHOT   Erythromycin Nausea And Vomiting   Morphine And Related Hives, Itching, Swelling   Other Hives, Itching, Swelling   COPOLYMERS= GLUES, ADHESIVE, FOUND IN A LOT OF PRODUCTS   Morphine Itching  Medication List       Accurate as of 09/05/17 12:00 PM. Always use your most recent med list.          Azelastine HCl 0.15 % Soln Place 2 sprays into the nose 2 (two) times daily as needed.   CALCIUM-MAGNESIUM PO Take 1 tablet by mouth daily.   Carbinoxamine Maleate 6 MG Tabs Commonly known as:  RYVENT Take 6 mg by mouth every 8 (eight) hours as needed. Take 6mg  every 6-8 hours as needed   cetirizine 10 MG tablet Commonly known as:  ZYRTEC Take 10 mg by mouth every evening.   FISH OIL PO Take 1 capsule by mouth daily.   HYDROcodone-acetaminophen 5-325 MG tablet Commonly known as:  NORCO Take 1-2 tablets by mouth every 6 (six) hours as needed for moderate pain or severe pain.   ibuprofen 200 MG tablet Commonly known as:  ADVIL,MOTRIN Take 200-400 mg by mouth 2 (two) times daily as needed for headache or moderate pain.   LORazepam 1 MG tablet Commonly known as:  ATIVAN Take 1 tablet 20-30 min before port a cath removal.   tamoxifen 20 MG tablet Commonly known as:  NOLVADEX Take 20 mg by mouth at bedtime.   THERA-M Tabs Take by  mouth.       Known medication allergies: Allergies  Allergen Reactions  . Adhesive [Tape] Hives, Itching and Swelling  . Alcohol [Isopropyl Alcohol] Hives and Itching    WHEN PT GETS SHOTS- WIPE WITH RUBBING ALCOHOL, THEN WIPE THE RUBBING ALCOHOL OFF OF HER SKIN PRIOR TO ADMINISTERING THE SHOT  . Erythromycin Nausea And Vomiting  . Morphine And Related Hives, Itching and Swelling  . Other Hives, Itching and Swelling    COPOLYMERS= GLUES, ADHESIVE, FOUND IN A LOT OF PRODUCTS  . Morphine Itching    I appreciate the opportunity to take part in Dorethea's care. Please do not hesitate to contact me with questions.  Sincerely,   R. Edgar Frisk, MD

## 2017-09-06 ENCOUNTER — Other Ambulatory Visit: Payer: Managed Care, Other (non HMO)

## 2017-09-13 ENCOUNTER — Ambulatory Visit
Admission: RE | Admit: 2017-09-13 | Discharge: 2017-09-13 | Disposition: A | Discharge status: Home / Self Care | Payer: Managed Care, Other (non HMO) | Source: Ambulatory Visit | Attending: Obstetrics and Gynecology | Admitting: Obstetrics and Gynecology

## 2017-09-13 DIAGNOSIS — Z853 Personal history of malignant neoplasm of breast: Secondary | ICD-10-CM

## 2017-09-13 HISTORY — DX: Personal history of antineoplastic chemotherapy: Z92.21

## 2017-09-13 HISTORY — DX: Personal history of irradiation: Z92.3

## 2017-09-18 ENCOUNTER — Ambulatory Visit (INDEPENDENT_AMBULATORY_CARE_PROVIDER_SITE_OTHER): Payer: Managed Care, Other (non HMO) | Admitting: Allergy and Immunology

## 2017-09-18 ENCOUNTER — Encounter: Payer: Self-pay | Admitting: Allergy and Immunology

## 2017-09-18 DIAGNOSIS — L253 Unspecified contact dermatitis due to other chemical products: Secondary | ICD-10-CM | POA: Diagnosis not present

## 2017-09-18 NOTE — Progress Notes (Signed)
Follow-up Note  RE: Gina Dickson MRN: [id number] DOB: [birth year] Date of Office Visit: 09/18/2017  Primary care provider: [name], MD Referring provider: [name], MD  History of present illness: Gina Dickson is a 51 y.o. 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   Assessment and plan: Contact dermatitis due to chemicals  T.R.U.E. patch test panel has been placed.  Instructions have been provided regarding keeping the patches clean and dry as well as returning at appropriate intervals for patch test interpretation.   Diagnostics: Patch tests were placed.    Physical examination: Blood pressure 108/68, pulse 72, resp. rate 16.  General: Alert, interactive, in no acute distress. Neck: Supple without lymphadenopathy. Lungs: Clear to auscultation without wheezing, rhonchi or rales. CV: Normal S1, S2 without murmurs. Skin: Warm and dry, without lesions or rashes.  The following portions of the patient's history were reviewed and updated as appropriate: allergies, current medications, past family history, past medical history, past social history, past surgical history and problem list.  Allergies as of 09/18/2017      Reactions   Adhesive [tape] Hives, Itching, Swelling   Alcohol [isopropyl Alcohol] Hives, Itching   WHEN PT GETS SHOTS- WIPE WITH RUBBING ALCOHOL, THEN WIPE THE RUBBING ALCOHOL OFF OF HER SKIN PRIOR TO ADMINISTERING THE SHOT   Erythromycin Nausea And Vomiting   Morphine And Related Hives, Itching, Swelling   Other Hives, Itching, Swelling   COPOLYMERS= GLUES, ADHESIVE, FOUND IN A LOT OF PRODUCTS   Morphine Itching      Medication List       Accurate as of 09/18/17  7:42 PM. Always use your most recent med list.          Azelastine HCl 0.15 % Soln Place 2 sprays into the nose 2 (two) times daily as needed.   CALCIUM-MAGNESIUM PO Take 1 tablet by mouth daily.   Carbinoxamine  Maleate 6 MG Tabs Commonly known as:  RYVENT Take 6 mg by mouth every 8 (eight) hours as needed. Take 6mg  every 6-8 hours as needed   cetirizine 10 MG tablet Commonly known as:  ZYRTEC Take 10 mg by mouth every evening.   FISH OIL PO Take 1 capsule by mouth daily.   HYDROcodone-acetaminophen 5-325 MG tablet Commonly known as:  NORCO Take 1-2 tablets by mouth every 6 (six) hours as needed for moderate pain or severe pain.   ibuprofen 200 MG tablet Commonly known as:  ADVIL,MOTRIN Take 200-400 mg by mouth 2 (two) times daily as needed for headache or moderate pain.   LORazepam 1 MG tablet Commonly known as:  ATIVAN Take 1 tablet 20-30 min before port a cath removal.   tamoxifen 20 MG tablet Commonly known as:  NOLVADEX Take 20 mg by mouth at bedtime.   THERA-M Tabs Take by mouth.       Allergies  Allergen Reactions  . Adhesive [Tape] Hives, Itching and Swelling  . Alcohol [Isopropyl Alcohol] Hives and Itching    WHEN PT GETS SHOTS- WIPE WITH RUBBING ALCOHOL, THEN WIPE THE RUBBING ALCOHOL OFF OF HER SKIN PRIOR TO ADMINISTERING THE SHOT  . Erythromycin Nausea And Vomiting  . Morphine And Related Hives, Itching and Swelling  . Other Hives, Itching and Swelling    COPOLYMERS= GLUES, ADHESIVE, FOUND IN A LOT OF PRODUCTS  . Morphine Itching    I appreciate the opportunity to take part in Gina Dickson's care. Please do not hesitate to contact me with  questions.  Sincerely,   R. Edgar Frisk, MD

## 2017-09-18 NOTE — Patient Instructions (Signed)
Contact dermatitis due to chemicals  T.R.U.E. patch test panel has been placed.  Instructions have been provided regarding keeping the patches clean and dry as well as returning at appropriate intervals for patch test interpretation.   Return in about 2 days (around 09/20/2017) for patch test interpretation.

## 2017-09-18 NOTE — Assessment & Plan Note (Signed)
   T.R.U.E. patch test panel has been placed.  Instructions have been provided regarding keeping the patches clean and dry as well as returning at appropriate intervals for patch test interpretation.

## 2017-09-20 ENCOUNTER — Ambulatory Visit: Payer: Managed Care, Other (non HMO) | Admitting: Allergy & Immunology

## 2017-09-20 DIAGNOSIS — L253 Unspecified contact dermatitis due to other chemical products: Secondary | ICD-10-CM

## 2017-09-20 NOTE — Progress Notes (Signed)
    Follow-up Note  RE: Gina Dickson MRN: [id number] DOB: 1966/02/26 Date of Office Visit: 09/20/2017  Primary care provider: [name], MD Referring provider: [name], MD   Gina Dickson returns to the office today for the initial patch test interpretation, given suspected history of contact dermatitis.    Diagnostics:   TRUE TEST 48-hour hour reading: minor irritation/scaling to scaly reaction to #7 (Colophony), scaly reaction to #19 (Methyldibromo Glutaronitrile), scaly reaction to #25 (Diazolidinyl urea), scaly reaction to #26 (Quinoline mix), scaly reaction to #31 (Hydrocortisone-17-butyrate), scaly reaction to #32 (Mercapto-benzothiazole) and scaly reaction to #35 (Disperse blue). There was even a scaly reaction to the negative control.   Plan:   Allergic contact dermatitis - The patient was not given any information on the above chemicals since their reaction was essentially the same as the negative control.  - I advised her that we would see which ones, if any, react on Friday.     Salvatore Marvel, MD [facility]

## 2017-09-22 ENCOUNTER — Ambulatory Visit: Payer: Managed Care, Other (non HMO) | Admitting: Allergy

## 2017-09-22 DIAGNOSIS — L253 Unspecified contact dermatitis due to other chemical products: Secondary | ICD-10-CM

## 2017-09-22 NOTE — Progress Notes (Signed)
    Follow-up Note  RE: Tassie Pollett MRN: [id number] DOB: [birth year] Date of Office Visit: 09/22/2017  Primary care provider: [name], MD Referring provider: [name], MD   Tamme returns to the office today for the initial patch test interpretation, given suspected history of contact dermatitis.    Diagnostics:  TRUE TEST 96 hour reading:  #18 quaternium 15 with mild erythema, #19 methyldibromo glutaronitrile with mild erythema, #28 gold with erythematous papules that have excoriated.     48hr reading: minor irritation/scaling to scaly reaction to #7 (Colophony), scaly reaction to #19 (Methyldibromo Glutaronitrile), scaly reaction to #25 (Diazolidinyl urea), scaly reaction to #26 (Quinoline mix), scaly reaction to #31 (Hydrocortisone-17-butyrate), scaly reaction to #32 (Mercapto-benzothiazole) and scaly reaction to #35 (Disperse blue). There was even a scaly reaction to the negative control.   Plan:  Allergic contact dermatitis  The patient has been provided detailed information regarding the substances she is sensitive to, as well as products containing the substances.  Meticulous avoidance of these substances is recommended. If avoidance is not possible, the use of barrier creams or lotions is recommended. If symptoms persist or progress despite meticulous avoidance of 3 above substances positive on 96 hr reading, dermatology evaluation may be warranted.  Prudy Feeler, MD [facility]

## 2017-09-25 ENCOUNTER — Encounter: Payer: Managed Care, Other (non HMO) | Admitting: Allergy and Immunology

## 2017-11-15 DIAGNOSIS — E663 Overweight: Secondary | ICD-10-CM | POA: Insufficient documentation

## 2018-07-12 ENCOUNTER — Other Ambulatory Visit: Payer: Self-pay | Admitting: Obstetrics and Gynecology

## 2018-07-12 DIAGNOSIS — Z9889 Other specified postprocedural states: Secondary | ICD-10-CM

## 2018-09-21 ENCOUNTER — Ambulatory Visit
Admission: RE | Admit: 2018-09-21 | Discharge: 2018-09-21 | Disposition: A | Discharge status: Home / Self Care | Payer: 59 | Source: Ambulatory Visit | Attending: Obstetrics and Gynecology | Admitting: Obstetrics and Gynecology

## 2018-09-21 DIAGNOSIS — Z9889 Other specified postprocedural states: Secondary | ICD-10-CM

## 2018-09-21 DIAGNOSIS — Z853 Personal history of malignant neoplasm of breast: Secondary | ICD-10-CM | POA: Diagnosis not present

## 2018-09-21 DIAGNOSIS — R928 Other abnormal and inconclusive findings on diagnostic imaging of breast: Secondary | ICD-10-CM | POA: Diagnosis not present

## 2018-11-22 ENCOUNTER — Ambulatory Visit (INDEPENDENT_AMBULATORY_CARE_PROVIDER_SITE_OTHER): Payer: 59 | Admitting: Family Medicine

## 2018-11-22 ENCOUNTER — Encounter: Payer: Self-pay | Admitting: Family Medicine

## 2018-11-22 VITALS — BP 116/67 | HR 68 | Ht 64.0 in | Wt 169.0 lb

## 2018-11-22 DIAGNOSIS — M7711 Lateral epicondylitis, right elbow: Secondary | ICD-10-CM | POA: Diagnosis not present

## 2018-11-22 MED ORDER — NITROGLYCERIN 0.2 MG/HR TD PT24: MEDICATED_PATCH | TRANSDERMAL | 1 refills | Status: AC

## 2018-11-22 NOTE — Progress Notes (Signed)
    Subjective:    CC: Elbow pain  HPI: Khyra notes a 2 to 35-month history of right lateral elbow pain.  She is right-handed and plays tennis and notes that pain is worse with grip wrist extension and tennis.  She notes especially that overhead shots are problematic.  She denies any significant neck pain or radiating pain down her arm.  She denies any injury.  She is tried ibuprofen and Tylenol and Aleve which have helped a bit.  She is been doing a little bit of exercises and has tried a counterforce brace a bit and notes that it is not helped much.  She thinks that she may have tennis elbow.  Medical history significant for right breast cancer originally diagnosed about 2 years ago. Past medical history, Surgical history, Family history not pertinant except as noted below, Social history, Allergies, and medications have been entered into the medical record, reviewed, and no changes needed.   Review of Systems: No headache, visual changes, nausea, vomiting, diarrhea, constipation, dizziness, abdominal pain, skin rash, fevers, chills, night sweats, weight loss, swollen lymph nodes, body aches, joint swelling, muscle aches, chest pain, shortness of breath, mood changes, visual or auditory hallucinations.   Objective:    Vitals:   11/22/18 1304  BP: 116/67  Pulse: 68   General: Well Developed, well nourished, and in no acute distress.  Neuro/Psych: Alert and oriented x3, extra-ocular muscles intact, able to move all 4 extremities, sensation grossly intact. Skin: Warm and dry, no rashes noted.  Respiratory: Not using accessory muscles, speaking in full sentences, trachea midline.  Cardiovascular: Pulses palpable, no extremity edema. Abdomen: Does not appear distended. MSK: C-spine nontender to midline.  Normal neck motion negative Spurling's test. Right elbow normal-appearing tender palpation lateral epicondyle.  Pain with resisted wrist extension. Normal elbow motion.  Strength is  intact. Contralateral left elbow normal-appearing nontender normal motion. Pulses capillary fill and sensation are intact distally bilateral upper extremities.   Lab and Radiology Results Limited musculoskeletal ultrasound right lateral elbow.  Hypoechoic change present at lateral epicondyle tip consistent with lateral epicondylitis.  No full-thickness tears visible.  No other significant changes present. Impression lateral epicondylitis  Impression and Recommendations:    Assessment and Plan: 53 y.o. female with right elbow pain due to lateral epicondylitis.  Discussed treatment options.  Reinforced correct home exercise program technique, additionally will use nitroglycerin patches.  Additionally recommend some equipment change including decreased string tension.  Recheck in about 4 weeks or so or sooner if needed.Marland Kitchen  PDMP reviewed during this encounter. No orders of the defined types were placed in this encounter.  Meds ordered this encounter  Medications  . nitroGLYCERIN (NITRODUR - DOSED IN MG/24 HR) 0.2 mg/hr patch    Sig: Apply to the elbow for tendonitis daily    Dispense:  30 patch    Refill:  1    Discussed warning signs or symptoms. Please see discharge instructions. Patient expresses understanding.

## 2018-11-22 NOTE — Patient Instructions (Addendum)
Thank you for coming in today. I think this is tennis elbow.   Do the stretch a much as you can.  Do the exercise where the wrist goes from up to down slowly with resistance.  Exercises 2-3x daily  Nitroglycerin Protocol   Apply 1/4 nitroglycerin patch to affected area daily.  Change position of patch within the affected area every 24 hours.  You may experience a headache during the first 1-2 weeks of using the patch, these should subside.  If you experience headaches after beginning nitroglycerin patch treatment, you may take your preferred over the counter pain reliever.  Another side effect of the nitroglycerin patch is skin irritation or rash related to patch adhesive.  Please notify our office if you develop more severe headaches or rash, and stop the patch.  Tendon healing with nitroglycerin patch may require 12 to 24 weeks depending on the extent of injury.  Men should not use if taking Viagra, Cialis, or Levitra.   Do not use if you have migraines or rosacea.    Tennis Elbow  Tennis elbow (lateral epicondylitis) is inflammation of tendons in your outer forearm, near your elbow. Tendons are tissues that connect muscle to bone. When you have tennis elbow, inflammation affects the tendons that you use to bend your wrist and move your hand up. Inflammation occurs in the lower part of the upper arm bone (humerus), where the tendons connect to the bone (lateral epicondyle). Tennis elbow often affects people who play tennis, but anyone may get the condition from repeatedly extending the wrist or turning the forearm. What are the causes? This condition is usually caused by repeatedly extending the wrist, turning the forearm, and using the hands. It can result from sports or work that requires repetitive forearm movements. In some cases, it may be caused by a sudden injury. What increases the risk? You are more likely to develop tennis elbow if you play tennis or another racket  sport. You also have a higher risk if you frequently use your hands for work. Besides people who play tennis, others at greater risk include:  Musicians.  Carpenters, painters, and plumbers.  Cooks.  Cashiers.  People who work in [employer].  Architect workers.  Butchers.  People who use computers. What are the signs or symptoms? Symptoms of this condition include:  Pain and tenderness in the forearm and the outer part of the elbow. Pain may be felt only when using the arm, or it may be there all the time.  A burning feeling that starts in the elbow and spreads down the forearm.  A weak grip in the hand. How is this diagnosed? This condition may be diagnosed based on:  Your symptoms and medical history.  A physical exam.  X-rays.  MRI. How is this treated? Resting and icing your arm is often the first treatment. Your health care provider may also recommend:  Medicines to reduce pain and inflammation. These may be in the form of a pill, topical gels, or shots of a steroid medicine (cortisone).  An elbow strap to reduce stress on the area.  Physical therapy. This may include massage or exercises.  An elbow brace to restrict the movements that cause symptoms. If these treatments do not help relieve your symptoms, your health care provider may recommend surgery to remove damaged muscle and reattach healthy muscle to bone. Follow these instructions at home: Activity  Rest your elbow and wrist and avoid activities that cause symptoms, as told by your  health care provider.  Do physical therapy exercises as instructed.  If you lift an object, lift it with your palm facing up. This reduces stress on your elbow. Lifestyle  If your tennis elbow is caused by sports, check your equipment and make sure that: ? You are using it correctly. ? It is the best fit for you.  If your tennis elbow is caused by work or computer use, take frequent breaks to stretch your arm. Talk  with your manager about ways to manage your condition at work. If you have a brace:  Wear the brace or strap as told by your health care provider. Remove it only as told by your health care provider.  Loosen the brace if your fingers tingle, become numb, or turn cold and blue.  Keep the brace clean.  If the brace is not waterproof, ask if you may remove it for bathing. If you must keep the brace on while bathing: ? Do not let it get wet. ? Cover it with a watertight covering when you take a bath or a shower. General instructions   If directed, put ice on the painful area: ? Put ice in a plastic bag. ? Place a towel between your skin and the bag. ? Leave the ice on for 20 minutes, 2-3 times a day.  Take over-the-counter and prescription medicines only as told by your health care provider.  Keep all follow-up visits as told by your health care provider. This is important. Contact a health care provider if:  You have pain that gets worse or does not get better with treatment.  You have numbness or weakness in your forearm, hand, or fingers. Summary  Tennis elbow (lateral epicondylitis) is inflammation of tendons in your outer forearm, near your elbow.  Common symptoms include pain and tenderness in your forearm and the outer part of your elbow.  This condition is usually caused by repeatedly extending your wrist, turning your forearm, and using your hands.  The first treatment is often resting and icing your arm to relieve symptoms. Further treatment may include taking medicine, getting physical therapy, wearing a brace or strap, or having surgery. This information is not intended to replace advice given to you by your health care provider. Make sure you discuss any questions you have with your health care provider. Document Released: 11/07/2005 Document Revised: 08/22/2017 Document Reviewed: 08/22/2017 Elsevier Interactive Patient Education  2019 [employer].

## 2018-11-23 ENCOUNTER — Encounter: Payer: Self-pay | Admitting: Family Medicine

## 2018-11-23 DIAGNOSIS — M7711 Lateral epicondylitis, right elbow: Secondary | ICD-10-CM | POA: Insufficient documentation

## 2018-11-28 ENCOUNTER — Telehealth: Payer: Self-pay | Admitting: Family Medicine

## 2018-11-28 NOTE — Telephone Encounter (Signed)
Pt had called and left a message for a return call regarding correct device to be using . Attempted to return call, pt unavailable. Left voicemail for pt to return call if she still needed advice.

## 2018-11-29 DIAGNOSIS — G620 Drug-induced polyneuropathy: Secondary | ICD-10-CM | POA: Diagnosis not present

## 2018-11-29 DIAGNOSIS — Z5181 Encounter for therapeutic drug level monitoring: Secondary | ICD-10-CM | POA: Diagnosis not present

## 2018-11-29 DIAGNOSIS — Z87898 Personal history of other specified conditions: Secondary | ICD-10-CM | POA: Diagnosis not present

## 2018-11-29 DIAGNOSIS — C50411 Malignant neoplasm of upper-outer quadrant of right female breast: Secondary | ICD-10-CM | POA: Diagnosis not present

## 2018-11-29 DIAGNOSIS — T451X5A Adverse effect of antineoplastic and immunosuppressive drugs, initial encounter: Secondary | ICD-10-CM | POA: Diagnosis not present

## 2018-12-21 DIAGNOSIS — H01009 Unspecified blepharitis unspecified eye, unspecified eyelid: Secondary | ICD-10-CM | POA: Diagnosis not present

## 2018-12-21 DIAGNOSIS — H04123 Dry eye syndrome of bilateral lacrimal glands: Secondary | ICD-10-CM | POA: Diagnosis not present

## 2018-12-21 DIAGNOSIS — M3501 Sicca syndrome with keratoconjunctivitis: Secondary | ICD-10-CM | POA: Diagnosis not present

## 2018-12-28 DIAGNOSIS — H01009 Unspecified blepharitis unspecified eye, unspecified eyelid: Secondary | ICD-10-CM | POA: Diagnosis not present

## 2018-12-28 DIAGNOSIS — H04123 Dry eye syndrome of bilateral lacrimal glands: Secondary | ICD-10-CM | POA: Diagnosis not present

## 2018-12-28 DIAGNOSIS — M3501 Sicca syndrome with keratoconjunctivitis: Secondary | ICD-10-CM | POA: Diagnosis not present

## 2019-01-17 ENCOUNTER — Other Ambulatory Visit: Payer: Self-pay | Admitting: Obstetrics and Gynecology

## 2019-01-17 DIAGNOSIS — Z6827 Body mass index (BMI) 27.0-27.9, adult: Secondary | ICD-10-CM | POA: Diagnosis not present

## 2019-01-17 DIAGNOSIS — Z01419 Encounter for gynecological examination (general) (routine) without abnormal findings: Secondary | ICD-10-CM | POA: Diagnosis not present

## 2019-01-21 LAB — CYTOLOGY - PAP

## 2019-05-01 DIAGNOSIS — R000 Tachycardia, unspecified: Secondary | ICD-10-CM | POA: Insufficient documentation

## 2019-05-01 DIAGNOSIS — R0781 Pleurodynia: Secondary | ICD-10-CM | POA: Insufficient documentation

## 2019-08-26 ENCOUNTER — Other Ambulatory Visit: Payer: Self-pay | Admitting: Obstetrics and Gynecology

## 2019-08-26 DIAGNOSIS — Z853 Personal history of malignant neoplasm of breast: Secondary | ICD-10-CM

## 2019-09-24 ENCOUNTER — Ambulatory Visit
Admission: RE | Admit: 2019-09-24 | Discharge: 2019-09-24 | Disposition: A | Discharge status: Home / Self Care | Payer: 59 | Source: Ambulatory Visit | Attending: Obstetrics and Gynecology | Admitting: Obstetrics and Gynecology

## 2019-09-24 ENCOUNTER — Other Ambulatory Visit: Payer: Self-pay

## 2019-09-24 DIAGNOSIS — Z853 Personal history of malignant neoplasm of breast: Secondary | ICD-10-CM

## 2020-01-10 IMAGING — MG DIGITAL DIAGNOSTIC BILATERAL MAMMOGRAM WITH TOMO AND CAD
6 of 9 series · 6 of 25 positions shown · non-contrast
Comparison: Previous exam(s).

CLINICAL DATA: History of right breast cancer status post
lumpectomy in 4568.

EXAM:
DIGITAL DIAGNOSTIC BILATERAL MAMMOGRAM WITH CAD AND TOMO

[R CC]
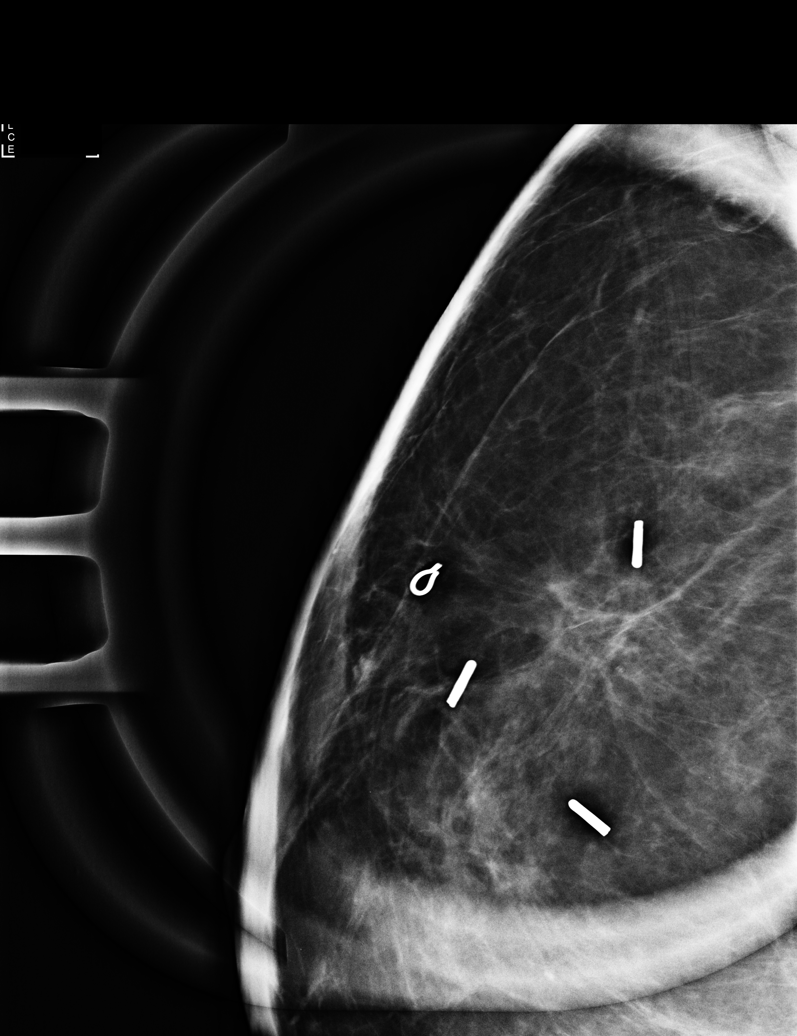

[L CC synth-2D]
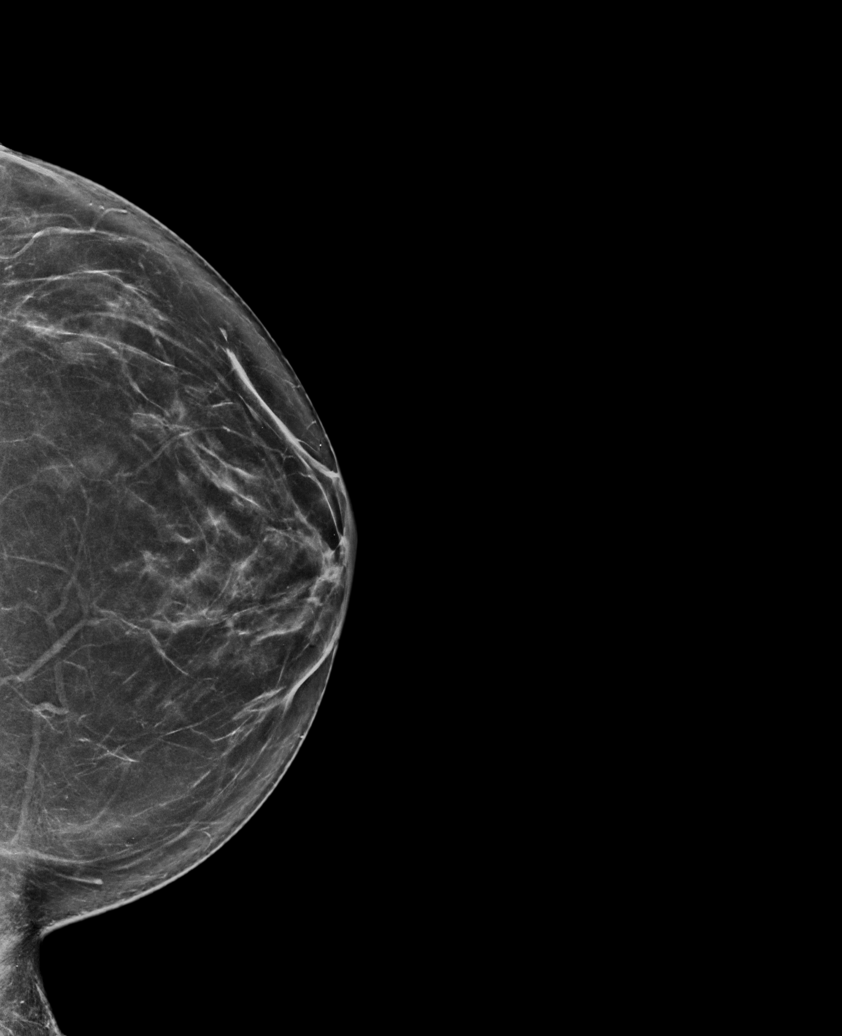

[L MLO synth-2D]
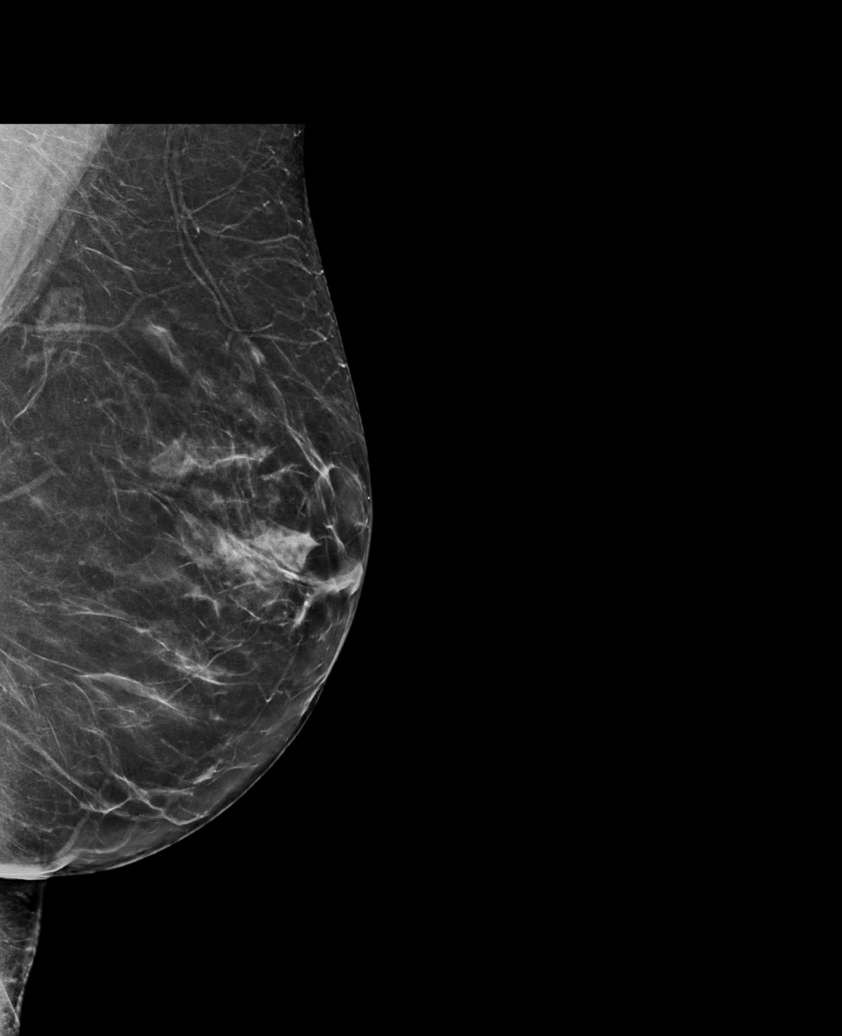

[R MLO synth-2D]
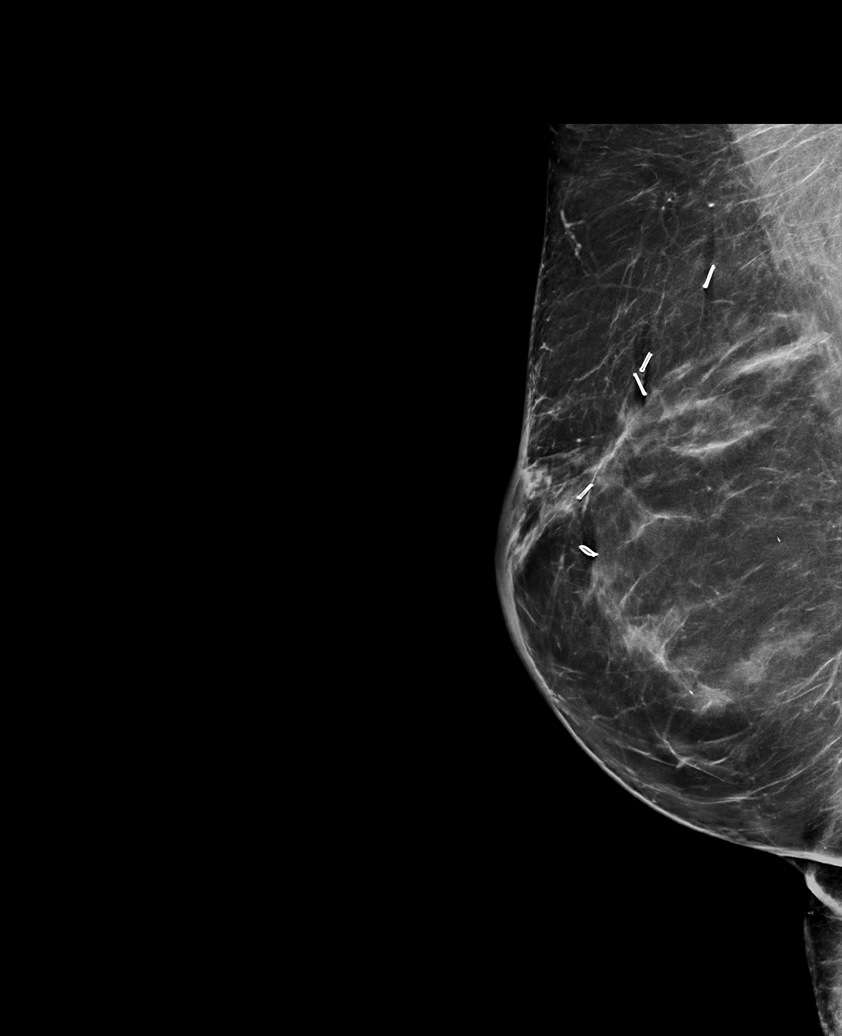

[R CC synth-2D]
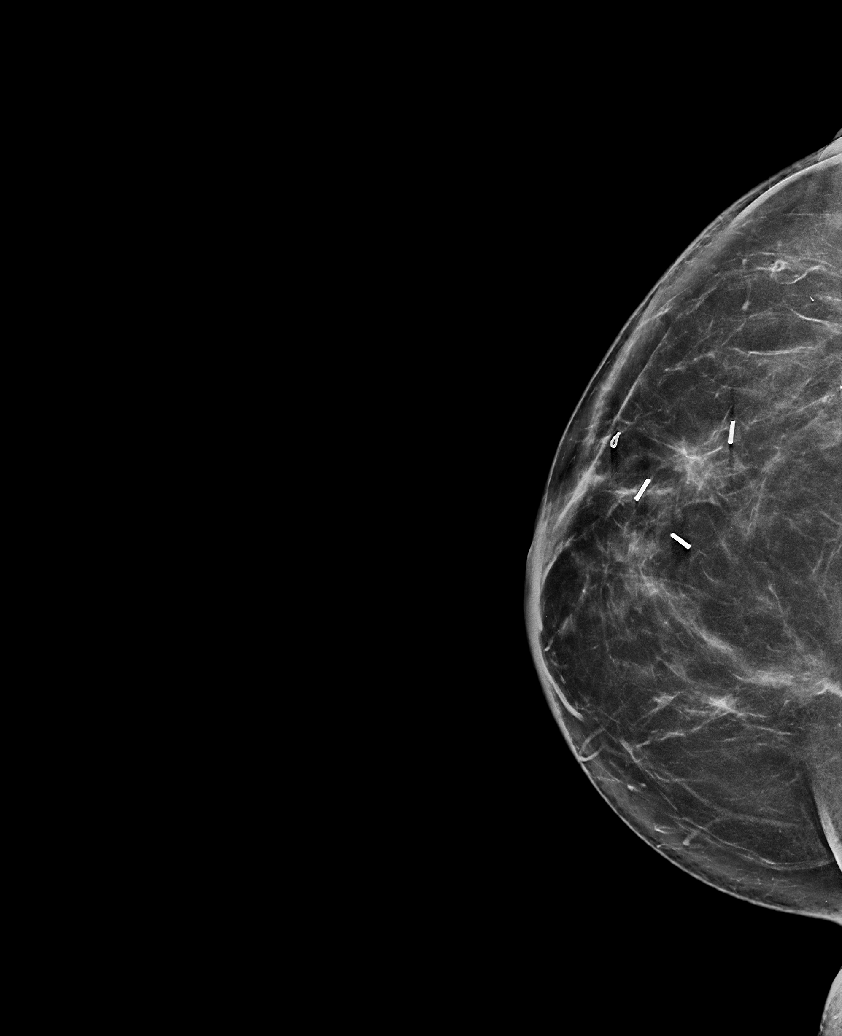

[L MLO tomo · tomo slice 39/76.0]
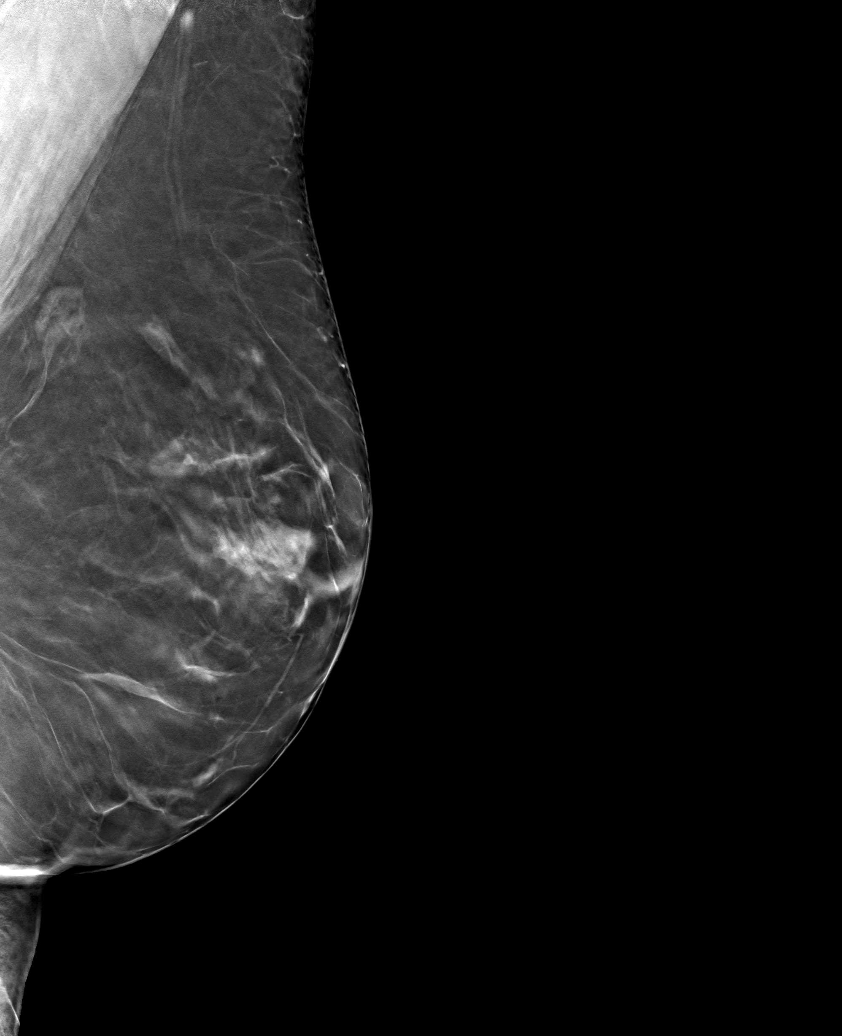

[6 of 25 positions shown; findings below may reference images not displayed]

ACR Breast Density Category b: There are scattered areas of
fibroglandular density.
FINDINGS: Stable lumpectomy changes are seen in the right breast. No
suspicious mass or malignant type microcalcifications identified in
either breast.

Mammographic images were processed with CAD.
IMPRESSION: No evidence of malignancy in either breast

RECOMMENDATION:
Bilateral diagnostic mammogram in 1 year is recommended.

I have discussed the findings and recommendations with the patient.
Results were also provided in writing at the conclusion of the
visit. If applicable, a reminder letter will be sent to the patient
regarding the next appointment.

BI-RADS CATEGORY  2: Benign.

## 2020-08-26 ENCOUNTER — Other Ambulatory Visit: Payer: Self-pay | Admitting: Obstetrics and Gynecology

## 2020-08-26 DIAGNOSIS — Z9889 Other specified postprocedural states: Secondary | ICD-10-CM

## 2020-09-24 ENCOUNTER — Ambulatory Visit
Admission: RE | Admit: 2020-09-24 | Discharge: 2020-09-24 | Disposition: A | Discharge status: Home / Self Care | Payer: 59 | Source: Ambulatory Visit | Attending: Obstetrics and Gynecology | Admitting: Obstetrics and Gynecology

## 2020-09-24 ENCOUNTER — Other Ambulatory Visit: Payer: Self-pay

## 2020-09-24 DIAGNOSIS — Z9889 Other specified postprocedural states: Secondary | ICD-10-CM

## 2021-04-08 ENCOUNTER — Other Ambulatory Visit: Payer: Self-pay

## 2021-04-08 ENCOUNTER — Encounter (HOSPITAL_COMMUNITY): Payer: Self-pay

## 2021-04-08 ENCOUNTER — Emergency Department (HOSPITAL_COMMUNITY)
Admission: EM | Admit: 2021-04-08 | Discharge: 2021-04-08 | Disposition: A | Discharge status: Home / Self Care | Payer: 59 | Attending: Emergency Medicine | Admitting: Emergency Medicine

## 2021-04-08 DIAGNOSIS — T50905A Adverse effect of unspecified drugs, medicaments and biological substances, initial encounter: Secondary | ICD-10-CM | POA: Insufficient documentation

## 2021-04-08 DIAGNOSIS — Z853 Personal history of malignant neoplasm of breast: Secondary | ICD-10-CM | POA: Insufficient documentation

## 2021-04-08 DIAGNOSIS — Z923 Personal history of irradiation: Secondary | ICD-10-CM | POA: Insufficient documentation

## 2021-04-08 DIAGNOSIS — Z9221 Personal history of antineoplastic chemotherapy: Secondary | ICD-10-CM | POA: Insufficient documentation

## 2021-04-08 DIAGNOSIS — R112 Nausea with vomiting, unspecified: Secondary | ICD-10-CM | POA: Diagnosis not present

## 2021-04-08 DIAGNOSIS — X58XXXA Exposure to other specified factors, initial encounter: Secondary | ICD-10-CM | POA: Diagnosis not present

## 2021-04-08 LAB — CBC WITH DIFFERENTIAL/PLATELET
Abs Immature Granulocytes: 0.04 10*3/uL (ref 0.00–0.07)
Basophils Absolute: 0 10*3/uL (ref 0.0–0.1)
Basophils Relative: 0 %
Eosinophils Absolute: 0 10*3/uL (ref 0.0–0.5)
Eosinophils Relative: 0 %
HCT: 37 % (ref 36.0–46.0)
Hemoglobin: 12.6 g/dL (ref 12.0–15.0)
Immature Granulocytes: 1 %
Lymphocytes Relative: 11 %
Lymphs Abs: 0.9 10*3/uL (ref 0.7–4.0)
MCH: 32.3 pg (ref 26.0–34.0)
MCHC: 34.1 g/dL (ref 30.0–36.0)
MCV: 94.9 fL (ref 80.0–100.0)
Monocytes Absolute: 0.2 10*3/uL (ref 0.1–1.0)
Monocytes Relative: 3 %
Neutro Abs: 7.2 10*3/uL (ref 1.7–7.7)
Neutrophils Relative %: 85 %
Platelets: 287 10*3/uL (ref 150–400)
RBC: 3.9 MIL/uL (ref 3.87–5.11)
RDW: 13.3 % (ref 11.5–15.5)
WBC: 8.4 10*3/uL (ref 4.0–10.5)
nRBC: 0 % (ref 0.0–0.2)

## 2021-04-08 LAB — COMPREHENSIVE METABOLIC PANEL
ALT: 19 U/L (ref 0–44)
AST: 33 U/L (ref 15–41)
Albumin: 3.9 g/dL (ref 3.5–5.0)
Alkaline Phosphatase: 37 U/L — ABNORMAL LOW (ref 38–126)
Anion gap: 13 (ref 5–15)
BUN: 13 mg/dL (ref 6–20)
CO2: 21 mmol/L — ABNORMAL LOW (ref 22–32)
Calcium: 9.1 mg/dL (ref 8.9–10.3)
Chloride: 107 mmol/L (ref 98–111)
Creatinine, Ser: 0.65 mg/dL (ref 0.44–1.00)
GFR, Estimated: >60 mL/min (ref >60)
Glucose, Bld: 107 mg/dL — ABNORMAL HIGH (ref 70–99)
Potassium: 3.3 mmol/L — ABNORMAL LOW (ref 3.5–5.1)
Sodium: 141 mmol/L (ref 135–145)
Total Bilirubin: 0.7 mg/dL (ref 0.3–1.2)
Total Protein: 6.9 g/dL (ref 6.5–8.1)

## 2021-04-08 LAB — LIPASE, BLOOD: Lipase: 24 U/L (ref 11–51)

## 2021-04-08 LAB — CBG MONITORING, ED: Glucose-Capillary: 92 mg/dL (ref 70–99)

## 2021-04-08 MED ORDER — SODIUM CHLORIDE 0.9 % IV BOLUS
500.0000 mL | Freq: Once | INTRAVENOUS | Status: AC
  Administered 2021-04-08: 500 mL via INTRAVENOUS

## 2021-04-08 MED ORDER — ONDANSETRON HCL 4 MG/2ML IJ SOLN
4.0000 mg | Freq: Once | INTRAMUSCULAR | Status: AC
  Administered 2021-04-08: 4 mg via INTRAVENOUS

## 2021-04-08 MED ORDER — LORAZEPAM 2 MG/ML IJ SOLN
1.0000 mg | Freq: Once | INTRAMUSCULAR | Status: AC
  Administered 2021-04-08: 1 mg via INTRAVENOUS
  Filled 2021-04-08: qty 1

## 2021-04-08 MED ORDER — SODIUM CHLORIDE 0.9 % IV SOLN
12.5000 mg | Freq: Once | INTRAVENOUS | Status: AC
  Administered 2021-04-08: 12.5 mg via INTRAVENOUS
  Filled 2021-04-08: qty 0.5
  Filled 2021-04-08: qty 12.5

## 2021-04-08 MED ORDER — ONDANSETRON 4 MG PO TBDP: 4.0000 mg | ORAL_TABLET | Freq: Three times a day (TID) | ORAL | 0 refills | Status: AC | PRN

## 2021-04-08 MED ORDER — METOCLOPRAMIDE HCL 5 MG/ML IJ SOLN
10.0000 mg | Freq: Once | INTRAMUSCULAR | Status: DC
  Filled 2021-04-08: qty 2

## 2021-04-08 MED ORDER — ONDANSETRON 8 MG PO TBDP: 8.0000 mg | ORAL_TABLET | Freq: Three times a day (TID) | ORAL | 0 refills | Status: AC | PRN

## 2021-04-08 MED ORDER — ONDANSETRON HCL 4 MG/2ML IJ SOLN: 4.0000 mg | Freq: Once | INTRAMUSCULAR | Status: DC

## 2021-04-08 MED ORDER — LORAZEPAM 1 MG PO TABS: 1.0000 mg | ORAL_TABLET | Freq: Three times a day (TID) | ORAL | 0 refills | Status: AC | PRN

## 2021-04-08 MED ORDER — DIPHENHYDRAMINE HCL 50 MG/ML IJ SOLN
12.5000 mg | Freq: Once | INTRAMUSCULAR | Status: DC
  Filled 2021-04-08: qty 1

## 2021-04-08 MED ORDER — ONDANSETRON HCL 4 MG/2ML IJ SOLN
4.0000 mg | Freq: Once | INTRAMUSCULAR | Status: AC
  Administered 2021-04-08: 4 mg via INTRAVENOUS
  Filled 2021-04-08: qty 2

## 2021-04-08 NOTE — ED Provider Notes (Signed)
[facility] DEPT Provider Note   CSN: [id number] Arrival date & time: 04/08/21  0413     History Chief Complaint  Patient presents with  . Emesis    Gina Dickson is a 55 y.o. female.  Patient to ED with nausea and vomiting that started early this morning. She feels it is a reaction to having started a medication yesterday. She had been on [facility] for weight loss, prescribed by a weight management provider. She went off the medication for a period of 3 months and restarted yesterday but restarted at her most recent dose instead of the lower starting dose and tapering up. No fever, diarrhea, chest pain, fever, SOB or urinary symptoms.   The history is provided by the patient. No language interpreter was used.       Past Medical History:  Diagnosis Date  . Anemia    hx  . Anxiety    Panic attack  x 1 - no medication  . Cancer ([place])    breast  . Colitis   . Complication of anesthesia   . Personal history of chemotherapy   . Personal history of radiation therapy   . PONV (postoperative nausea and vomiting)    last surgery arthroscopy no problem with nausea- please use same thing- No problem after Lumpectomy with N?V- Slow to awaken always  . Primary cancer of upper outer quadrant of right female breast ([place]) 10/06/2016  . PTSD (post-traumatic stress disorder)    was seen  by therapist  . Ulcerative colitis [facility])     Patient Active Problem List   Diagnosis Date Noted  . Lateral epicondylitis, right elbow 11/23/2018  . Contact dermatitis due to chemicals 09/05/2017  . Allergic rhinitis with a probable nonallergic component 09/05/2017  . Primary cancer of upper outer quadrant of right female breast ([place]) 10/06/2016    Past Surgical History:  Procedure Laterality Date  . ANTERIOR CRUCIATE LIGAMENT REPAIR  2007   right  . BREAST LUMPECTOMY Right 10/06/2016  . BREAST LUMPECTOMY WITH RADIOACTIVE SEED AND SENTINEL LYMPH NODE BIOPSY Right  10/06/2016   Procedure: BREAST LUMPECTOMY WITH RADIOACTIVE SEED AND SENTINEL LYMPH NODE BIOPSY;  Surgeon: [name], MD;  Location: [facility];  Service: General;  Laterality: Right;  . BREAST REDUCTION SURGERY Bilateral 2004  . CESAREAN SECTION    . COLONOSCOPY    . KNEE ARTHROSCOPY WITH MEDIAL MENISECTOMY Right 06/09/2014   Procedure: KNEE ARTHROSCOPY WITH PARTIAL MEDIAL MENISECTOMY, CHONDROPLASTY;  Surgeon: Hessie Dibble, MD;  Location: [facility];  Service: Orthopedics;  Laterality: Right;  . PARTIAL COLECTOMY  2000   ulcerative  . PORTACATH PLACEMENT N/A 11/22/2016   Procedure: INSERTION PORT-A-CATH WITH [place];  Surgeon: [name], MD;  Location: [facility];  Service: General;  Laterality: N/A;  . REDUCTION MAMMAPLASTY Bilateral 2014  . WISDOM TOOTH EXTRACTION       OB History   No obstetric history on file.     Family History  Problem Relation Age of Onset  . Asthma Mother   . Allergic rhinitis Neg Hx   . Angioedema Neg Hx   . Eczema Neg Hx   . Immunodeficiency Neg Hx   . Urticaria Neg Hx     Social History   Tobacco Use  . Smoking status: Never Smoker  . Smokeless tobacco: Never Used  Vaping Use  . Vaping Use: Never used  Substance Use Topics  . Alcohol use: Yes    Alcohol/week: 1.0 standard drink  Types: 1 Glasses of wine per week  . Drug use: No    Home Medications Prior to Admission medications   Medication Sig Start Date End Date Taking? Authorizing Provider  Azelastine HCl 0.15 % SOLN Place 2 sprays into the nose 2 (two) times daily as needed. 09/05/17   Bobbitt, [place] Muta, MD  CALCIUM-MAGNESIUM PO Take 1 tablet by mouth daily.    [provider]  Carbinoxamine Maleate (RYVENT) 6 MG TABS Take 6 mg by mouth every 8 (eight) hours as needed. Take 6mg  every 6-8 hours as needed 09/05/17   Bobbitt, [place] Muta, MD  cetirizine (ZYRTEC) 10 MG tablet Take 10 mg by mouth every evening.    [provider]  ibuprofen  (ADVIL,MOTRIN) 200 MG tablet Take 200-400 mg by mouth 2 (two) times daily as needed for headache or moderate pain.     [provider]  LORazepam (ATIVAN) 1 MG tablet Take 1 tablet 20-30 min before port a cath removal. 05/01/17   Eppie Gibson, MD  Multiple Vitamins-Minerals (THERA-M) TABS Take by mouth.    [provider]  nitroGLYCERIN (NITRODUR - DOSED IN MG/24 HR) 0.2 mg/hr patch Apply to the elbow for tendonitis daily 11/22/18   Gregor Hams, MD  Omega-3 Fatty Acids (FISH OIL PO) Take 1 capsule by mouth daily.    [provider]  tamoxifen (NOLVADEX) 20 MG tablet Take 20 mg by mouth at bedtime.  11/02/16   [provider]    Allergies    Adhesive [tape], Alcohol [isopropyl alcohol], Erythromycin, Morphine and related, Other, and Morphine  Review of Systems   Review of Systems  Constitutional: Negative for chills and fever.  HENT: Negative.   Respiratory: Negative.   Cardiovascular: Negative.   Gastrointestinal: Positive for nausea and vomiting.  Musculoskeletal: Negative.   Skin: Negative.   Neurological: Negative.     Physical Exam Updated Vital Signs BP (!) 142/99 (BP Location: Left Arm)   Pulse 64   Temp 99.3 F (37.4 C) (Oral)   Resp 20   Ht 5\' 4"  (1.626 m)   Wt 74 kg   SpO2 100%   BMI 28.00 kg/m   Physical Exam Vitals and nursing note reviewed.  Constitutional:      Appearance: She is well-developed.  HENT:     Head: Normocephalic.  Cardiovascular:     Rate and Rhythm: Normal rate and regular rhythm.  Pulmonary:     Effort: Pulmonary effort is normal.     Breath sounds: Normal breath sounds.  Abdominal:     Palpations: Abdomen is soft.     Tenderness: There is no abdominal tenderness. There is no guarding or rebound.  Musculoskeletal:        General: Normal range of motion.     Cervical back: Normal range of motion and neck supple.  Skin:    General: Skin is warm and dry.     Findings: No rash.  Neurological:      Mental Status: She is alert and oriented to person, place, and time.     ED Results / Procedures / Treatments   Labs (all labs ordered are listed, but only abnormal results are displayed) Labs Reviewed - No data to display  EKG None  Radiology No results found.  Procedures Procedures   Medications Ordered in ED Medications - No data to display  ED Course  I have reviewed the triage vital signs and the nursing notes.  Pertinent labs & imaging results that were  available during my care of the patient were reviewed by me and considered in my medical decision making (see chart for details).  Clinical Course as of 04/09/21 0736  Thu Apr 08, 2021  0808 Felt a little better, now more nauseous. [SJ]  2836 OQHUT with Lenna Sciara, pharmacist, about [phone].  She states she might expect the patient to continue to have symptoms for 1/2 to 1-week based on its half-life and dosing span. [SJ]  [phone] Patient reported to RN she has experienced little to no improvement following phenergan. [SJ]  1155 Patient resting comfortably. [SJ]  1240 BP(!): 84/54 Patient was lying on her side during this blood pressure reading. [SJ]    Clinical Course User Index [SJ] Joy, Helane Gunther, PA-C   MDM Rules/Calculators/A&P                          Patient to ED with onset N, V, AP after taking a large dose of Wegovy as detailed in HPI. On drug information review, patient's symptoms c/w noted adverse reactions to this medication.   She feels better after Zofran. Will attempt PO challenge. Labs pending. IV fluids provided.   Labs reviewed and are essentially unremarkable. On recheck after PO challenge she feels nauseous again. Phenergan ordered.   Patient care signed out to Arlean Hopping, [facility], pending recheck after Phenergan and further PO challenge.   Final Clinical Impression(s) / ED Diagnoses Final diagnoses:  None   1. Adverse medication reaction 2. Nausea and vomiting  Rx / DC Orders ED Discharge Orders     None       Dennie Bible 56/81/27 5170    Delora Fuel, MD 01/74/94 [phone]

## 2021-04-08 NOTE — Discharge Instructions (Addendum)
Follow up with your doctor for recheck in 1-2 days if symptoms persist. Take Zofran every 8 hours as needed for any recurrent nausea.  Nausea, Vomiting, and Diarrhea  Hand washing: Wash your hands throughout the day, but especially before and after touching the face, using the restroom, sneezing, coughing, or touching surfaces that have been coughed or sneezed upon. Hydration: Symptoms will be intensified and complicated by dehydration. Dehydration can also extend the duration of symptoms. Drink plenty of fluids and get plenty of rest. You should be drinking at least half a liter of water an hour to stay hydrated. Electrolyte drinks (ex. Gatorade, Powerade, Pedialyte) are also encouraged. You should be drinking enough fluids to make your urine light yellow, almost clear. If this is not the case, you are not drinking enough water. Please note that some of the treatments indicated below will not be effective if you are not adequately hydrated. Diet: Please concentrate on hydration, however, you may introduce food slowly.  Start with a clear liquid diet, progressed to a full liquid diet, and then bland solids as you are able. Pain or fever: Ibuprofen, Naproxen, or Tylenol for pain or fever.  Nausea/vomiting: Use the ondansetron (generic for Zofran) for nausea or vomiting.  This medication may not prevent all vomiting or nausea, but can help facilitate better hydration. Things that can help with nausea/vomiting also include peppermint/menthol candies, vitamin B12, and ginger. Lorazepam: May use the lorazepam either by swallowing the pill or letting it dissolve in your mouth, as needed, for nausea or vomiting that does not respond to the Zofran. Follow-up: Follow-up with a primary care provider on this matter. Return: Return should you develop a fever, bloody diarrhea, increased abdominal pain, uncontrolled vomiting, or any other major concerns.  For prescription assistance, may try using prescription  discount sites or apps, such as goodrx.com   Return to the ED if you develop any severe pain, uncontrolled vomiting or fever.

## 2021-04-08 NOTE — ED Provider Notes (Signed)
Gina Dickson is a 55 y.o. female, presenting to the ED with nausea and vomiting beginning late last night.  She administered a 2.4 mg maintenance dose of Wegovy yesterday.  She had not administered this medication for at least 6 weeks due to insurance change.   HPI from Charlann Lange, PA-C: "Gina Dickson is a 55 y.o. female.  Patient to ED with nausea and vomiting that started early this morning. She feels it is a reaction to having started a medication yesterday. She had been on [facility] for weight loss, prescribed by a weight management provider. She went off the medication for a period of 3 months and restarted yesterday but restarted at her most recent dose instead of the lower starting dose and tapering up. No fever, diarrhea, chest pain, fever, SOB or urinary symptoms."  Past Medical History:  Diagnosis Date  . Anemia    hx  . Anxiety    Panic attack  x 1 - no medication  . Cancer ([place])    breast  . Colitis   . Complication of anesthesia   . Personal history of chemotherapy   . Personal history of radiation therapy   . PONV (postoperative nausea and vomiting)    last surgery arthroscopy no problem with nausea- please use same thing- No problem after Lumpectomy with N?V- Slow to awaken always  . Primary cancer of upper outer quadrant of right female breast ([place]) 10/06/2016  . PTSD (post-traumatic stress disorder)    was seen  by therapist  . Ulcerative colitis ([place])     Physical Exam  BP 104/71   Pulse 85   Temp 99.3 F (37.4 C) (Oral)   Resp (!) 23   Ht 5\' 4"  (1.626 m)   Wt 74 kg   SpO2 97%   BMI 28.00 kg/m   Physical Exam Vitals and nursing note reviewed.  Constitutional:      General: She is not in acute distress.    Appearance: She is well-developed. She is not diaphoretic.  HENT:     Head: Normocephalic and atraumatic.     Mouth/Throat:     Mouth: Mucous membranes are moist.     Pharynx: Oropharynx is clear.  Eyes:     Conjunctiva/sclera: Conjunctivae  normal.  Cardiovascular:     Rate and Rhythm: Normal rate and regular rhythm.     Pulses: Normal pulses.          Radial pulses are 2+ on the right side and 2+ on the left side.       Posterior tibial pulses are 2+ on the right side and 2+ on the left side.     Heart sounds: Normal heart sounds.     Comments: Tactile temperature in the extremities appropriate and equal bilaterally. Pulmonary:     Effort: Pulmonary effort is normal. No respiratory distress.     Breath sounds: Normal breath sounds.  Abdominal:     Palpations: Abdomen is soft.     Tenderness: There is no abdominal tenderness. There is no guarding.  Musculoskeletal:     Cervical back: Neck supple.     Right lower leg: No edema.     Left lower leg: No edema.  Skin:    General: Skin is warm and dry.  Neurological:     Mental Status: She is alert.  Psychiatric:        Mood and Affect: Mood and affect normal.        Speech: Speech normal.  Behavior: Behavior normal.     ED Course/Procedures     Procedures   Abnormal Labs Reviewed  COMPREHENSIVE METABOLIC PANEL - Abnormal; Notable for the following components:      Result Value   Potassium 3.3 (*)    CO2 21 (*)    Glucose, Bld 107 (*)    Alkaline Phosphatase 37 (*)    All other components within normal limits   No results found.     MDM   Clinical Course as of 04/08/21 1532  Thu Apr 08, 2021  0808 Felt a little better, now more nauseous. [SJ]  7673 ALPFX with Lenna Sciara, pharmacist, about [phone].  She states she might expect the patient to continue to have symptoms for 1/2 to 1-week based on its half-life and dosing span. [SJ]  [phone] Patient reported to RN she has experienced little to no improvement following phenergan. [SJ]  1155 Patient resting comfortably. [SJ]  1240 BP(!): 84/54 Patient was lying on her side during this blood pressure reading. [SJ]    Clinical Course User Index [SJ] Lorayne Bender, PA-C   Patient care handoff report received  from Charlann Lange, [place]. Plan: Antiemetics and p.o. challenge.   At the beginning of my care of the patient, she endorsed generalized abdominal discomfort. Patient continued to have nausea while under my care.  She voiced improvement after Zofran, but continued to have intense nausea return.  Nausea resolved after Ativan.  It is quite possible patient is having side effects from reinitiating her Wegovy.  Research on this medication revealed this is quite a common side effect for this medication. Other intra-abdominal diagnoses were considered, however, serial abdominal exams were benign and her lab work was reassuring. Early in her time after I took over her care, patient stopped complaining of abdominal pain altogether. She was asked to follow-up with her PCP on this matter.  Tolerating oral fluids prior to discharge. The patient was given instructions for home care as well as return precautions. Patient voices understanding of these instructions, accepts the plan, and is comfortable with discharge.     Vitals:   04/08/21 0530 04/08/21 0600 04/08/21 0630 04/08/21 0700  BP: 115/78 105/78 104/71 101/74  Pulse: 93 97 85 85  Resp: 11 16 (!) 23 18  Temp:      TempSrc:      SpO2: 95% 100% 97% 100%  Weight:      Height:            Lorayne Bender, PA-C 04/08/21 1534    Wyvonnia Dusky, MD 04/08/21 1750

## 2021-04-08 NOTE — ED Notes (Signed)
Pt reported that she has a right arm restriction for bp/iv, however reports that her left arm is a very difficult stick and that her oncologist gave permission to use right arm if needed for iv stick and patient wishes to use right arm.  This information given to [facility] and Nehemiah Settle, [place], providers gave this writer permission to use right arm for IV stick.

## 2021-04-08 NOTE — ED Notes (Signed)
ED Provider at bedside. 

## 2021-04-08 NOTE — ED Triage Notes (Addendum)
Pt arrived via POV, states n/v. Pt states she started new rx about 5 wks ago, was not told to taper up, started on highest dose and has been okay until today but believes this is why she is vomiting.  Vomiting since this morning.

## 2021-04-08 NOTE — ED Notes (Signed)
Pt walked to restroom 

## 2021-04-08 NOTE — ED Notes (Signed)
Po challenge-water provided to pt by provider.

## 2021-04-08 NOTE — ED Notes (Signed)
CBG- 92 

## 2021-07-01 ENCOUNTER — Other Ambulatory Visit: Payer: Self-pay | Admitting: Obstetrics and Gynecology

## 2021-07-01 DIAGNOSIS — Z853 Personal history of malignant neoplasm of breast: Secondary | ICD-10-CM

## 2021-09-28 ENCOUNTER — Other Ambulatory Visit: Payer: Self-pay

## 2021-09-28 ENCOUNTER — Ambulatory Visit
Admission: RE | Admit: 2021-09-28 | Discharge: 2021-09-28 | Disposition: A | Discharge status: Home / Self Care | Payer: No Typology Code available for payment source | Source: Ambulatory Visit | Attending: Obstetrics and Gynecology | Admitting: Obstetrics and Gynecology

## 2021-09-28 DIAGNOSIS — Z853 Personal history of malignant neoplasm of breast: Secondary | ICD-10-CM

## 2021-10-05 ENCOUNTER — Encounter (INDEPENDENT_AMBULATORY_CARE_PROVIDER_SITE_OTHER): Payer: No Typology Code available for payment source | Admitting: Registered Nurse

## 2021-10-07 NOTE — Progress Notes (Unsigned)
[address]  [place], [place] [address]  Ph. [phone], Valinda Hoar [phone]                Date of Exam: 10/08/2021 10:42 PM        Patient ID: Lindsey Cox is a 55 y.o. female.  Attending Physician: Laurence Spates, DNP FNP        Chief Complaint:    No chief complaint on file.              HPI:    Pt is 55 yo female presents today for a wellness exam {WITH/OUT:28951} a pap smear. Pt is fasting today: ***    Chronic diseases: ***  Refills needed today: ***  Pharmacy: ***    Depression screen:   PHQ-2/9 score: ***  No concerns for SI or HI: ***    Vaccines:  - TDAP: ***  - FLU: will get it at a local pharmacy ***  - COVID: 4/4***  - Shingrix: ***    Regular screening:  Last pap smear:  ***  History of abnormal pap smear:  ***  LMP:  ***  Self breast exams: ***  Last mammogram: ***  Last colonoscopy: ***  Last eye exam: ***  Last dental exam: every 6 months ***  Exercise: ***  Diet: ***  H2O: adequate ***  Caffeine: ***   Sleep: 7-8 hrs***  Smoking: ***  Alcohol: ***  Sexually active: ***  Any concerns for STI: ***    New problems:            Problem List:    There is no problem list on file for this patient.            Current Meds:    No current outpatient medications on file.        Allergies:    Not on File       No past medical history on file.     Past Surgical History:    No past surgical history on file.        Family History:    No family history on file.        Social History:             The following sections were reviewed this encounter by the provider:            Vital Signs:    There were no vitals taken for this visit.         ROS:    Review of Systems   Constitutional:  Negative for activity change, fatigue and unexpected weight change.   HENT:  Negative for ear pain, hearing loss, sinus pain and trouble swallowing.    Eyes:  Negative for pain and visual disturbance.   Respiratory:  Negative for chest tightness and shortness of breath.    Cardiovascular:  Negative for  chest pain, palpitations and leg swelling.   Gastrointestinal:  Negative for abdominal pain, blood in stool, constipation, diarrhea, nausea and vomiting.   Endocrine: Negative for cold intolerance, heat intolerance, polydipsia and polyuria.   Genitourinary:  Negative for difficulty urinating, menstrual problem and pelvic pain.   Musculoskeletal:  Negative for arthralgias and joint swelling.   Skin:  Negative for color change and rash.   Neurological:  Negative for dizziness, tremors, speech difficulty, weakness, light-headedness, numbness and headaches.   Hematological:  Negative for adenopathy. Does not bruise/bleed easily.   Psychiatric/Behavioral:  Negative for dysphoric mood and suicidal ideas.  All other systems reviewed and are negative.           Physical Exam:    Physical Exam  Vitals reviewed.   Constitutional:       General: She is not in acute distress.     Appearance: Normal appearance. She is not ill-appearing.   HENT:      Head: Normocephalic and atraumatic.      Right Ear: Tympanic membrane, ear canal and external ear normal.      Left Ear: Tympanic membrane, ear canal and external ear normal.      Nose: Nose normal.      Mouth/Throat:      Mouth: Mucous membranes are moist. No oral lesions.   Eyes:      General: Lids are normal.      Extraocular Movements: Extraocular movements intact.      Right eye: No nystagmus.      Left eye: No nystagmus.      Pupils: Pupils are equal, round, and reactive to light.   Neck:      Thyroid: No thyroid mass, thyromegaly or thyroid tenderness.      Vascular: No carotid bruit.   Cardiovascular:      Rate and Rhythm: Normal rate and regular rhythm.      Pulses:           Radial pulses are 2+ on the right side and 2+ on the left side.        Dorsalis pedis pulses are 2+ on the right side and 2+ on the left side.      Heart sounds: Normal heart sounds.   Pulmonary:      Effort: Pulmonary effort is normal. No respiratory distress.      Breath sounds: Normal breath sounds  and air entry. No decreased breath sounds, wheezing, rhonchi or rales.   Abdominal:      General: Abdomen is flat. Bowel sounds are normal. There is no distension.      Palpations: Abdomen is soft. There is no hepatomegaly, splenomegaly or mass.      Tenderness: There is no abdominal tenderness.      Hernia: There is no hernia in the umbilical area or ventral area.   Genitourinary:     Comments: Discussion today with pt  No issues or concerns  Pt and I have decided to defer exam  Will followup if any issues at anytime    Musculoskeletal:      Cervical back: Neck supple.      Right lower leg: No edema.      Left lower leg: No edema.   Lymphadenopathy:      Cervical: No cervical adenopathy.   Skin:     General: Skin is warm and dry.      Findings: No lesion.      Nails: There is no clubbing.   Neurological:      General: No focal deficit present.      Mental Status: She is alert.      Motor: No tremor.      Deep Tendon Reflexes: Reflexes are normal and symmetric.   Psychiatric:         Behavior: Behavior is cooperative.         Thought Content: Thought content normal.              Assessment:    There are no diagnoses linked to this encounter.          Plan:  General Wellness:   Check screening blood work - informed pt results will be available via MyChart  Renew OCP for one year supply ***  Continue to see GYN for breast exams, mammograms, and pap smears   Pap smear performed -- results will be available via mychart***  Screening mammogram ordered --please schedule an appt at any [place] radiology centers to get that done. You don't need paper copy of the order if you go to [facility] radiology ***  Referred for screening colonoscopy- advised patient to schedule an appt with the gastroenterologist - (referral placed and recommendations provided) ***  Encouraged to regular exercise at least 3-4 times per week for 30-45 minutes per session of aerobic exercise  Encouraged healthy diet with 5-9 servings of fruits and  vegetables per day  Encouraged dental exam every 6-12 months  Encouraged eye exam every 1-2 years  Encouraged adequate daily H2O intake  Education provided regarding safe sun practices and healthy skin care  Continue specialist visits for your chronic diseases***  Please sign up to My Chart. This system allows you to review your medical records, lab results and it also allows you to e-mail me directly and or make appointments with me on line***.    Vaccines:  *** administered today - VIS provided to pt  Advised patient to get the COVID booster and flu vaccine ***at a local pharmacy  Pt educated regarding common symptoms and appropriate warning signs    All questions answered. Patient agreeable and amenable to plan. Patient verbalizes understanding.          Follow-up:    No follow-ups on file.         Laurence Spates, DNP FNP

## 2021-10-08 ENCOUNTER — Ambulatory Visit (INDEPENDENT_AMBULATORY_CARE_PROVIDER_SITE_OTHER): Payer: No Typology Code available for payment source | Admitting: Registered Nurse

## 2021-10-08 ENCOUNTER — Encounter (INDEPENDENT_AMBULATORY_CARE_PROVIDER_SITE_OTHER): Payer: Self-pay | Admitting: Registered Nurse

## 2021-10-08 ENCOUNTER — Ambulatory Visit: Payer: Self-pay

## 2021-10-08 VITALS — BP 94/60 | HR 72 | Temp 98.0°F | Resp 16 | Ht 64.5 in | Wt 141.0 lb

## 2021-10-08 DIAGNOSIS — Z1329 Encounter for screening for other suspected endocrine disorder: Secondary | ICD-10-CM

## 2021-10-08 DIAGNOSIS — Z131 Encounter for screening for diabetes mellitus: Secondary | ICD-10-CM

## 2021-10-08 DIAGNOSIS — Z13220 Encounter for screening for lipoid disorders: Secondary | ICD-10-CM

## 2021-10-08 DIAGNOSIS — Z1231 Encounter for screening mammogram for malignant neoplasm of breast: Secondary | ICD-10-CM

## 2021-10-08 DIAGNOSIS — C50411 Malignant neoplasm of upper-outer quadrant of right female breast: Secondary | ICD-10-CM

## 2021-10-08 DIAGNOSIS — Z124 Encounter for screening for malignant neoplasm of cervix: Secondary | ICD-10-CM

## 2021-10-08 DIAGNOSIS — Z0000 Encounter for general adult medical examination without abnormal findings: Secondary | ICD-10-CM

## 2021-10-08 DIAGNOSIS — K51019 Ulcerative (chronic) pancolitis with unspecified complications: Secondary | ICD-10-CM

## 2021-10-08 DIAGNOSIS — Z1211 Encounter for screening for malignant neoplasm of colon: Secondary | ICD-10-CM

## 2021-10-08 NOTE — Patient Instructions (Addendum)
Plan:    General Wellness:   Patient to get the screening blood work  at a local [facility]- informed pt results will be available via MyChart  Continue to see GYN for breast exams, mammograms  Pap smear performed -- results will be available via mycha  Referred for screening colonoscopy- advised patient to schedule an appt with the gastroenterologist - (referral placed and recommendations provided)   Encouraged to regular exercise at least 3-4 times per week for 30-45 minutes per session of aerobic exercise  Encouraged healthy diet with 5-9 servings of fruits and vegetables per day  Encouraged dental exam every 6-12 months  Encouraged eye exam every 1-2 years  Encouraged adequate daily H2O intake  Education provided regarding safe sun practices and healthy skin care  Continue specialist visits for your chronic diseases  Please sign up to My Chart. This system allows you to review your medical records, lab results and it also allows you to e-mail me directly and or make appointments with me on line.    Vaccines:  Advised patient to get the COVID booster and flu vaccine at a local pharmacy  And upload her tetanus records          Follow-up:    Return in about 1 year (around 10/08/2022) for annual exam or sooner as needed.        Gastroenterologists in area    [facility]  [address]  [place], [place] [address] [phone]  [email or web address]    [facility]  [address]  [place], [place] [address] [phone]  [email or web address]    [facility]  [address] [place], [place] [address]  [phone]  [email or web address]    OB/Gyn in the area    [facility]  Dr. Renea Ee Fellucca  Dr. Eulah Pont  Dr. Patric Dykes  Dr. Victorino Dike An  Dr. Sharol Harness  [address]  [place], [place] [address]  [phone]  [email or web address]      [facility] Garnet,  PC  [address]  [place], [place] [address]  [phone]  [email or web address]      Others:    Lenore Manner [phone] [place], [place]   [place]  [phone] [place], [place]  [place]  [address], [place]  [phone] [place], [place]

## 2021-10-11 LAB — THIN PREP PAP W/IMAGE ANALYSIS AND HPV
HPV Genotype, 16: NEGATIVE
HPV Genotype, 18: NEGATIVE
HPV High Risk, Other: NEGATIVE

## 2021-10-18 LAB — LAB USE ONLY - HISTORICAL GYN CYTOLOGY/PAP SMEAR

## 2021-10-26 ENCOUNTER — Telehealth (INDEPENDENT_AMBULATORY_CARE_PROVIDER_SITE_OTHER): Payer: Self-pay | Admitting: Registered Nurse

## 2021-10-26 NOTE — Telephone Encounter (Signed)
Name of Requester: patient     Clinical Request: Patient is requesting a call back from clinical regarding questions that were not answered during recent appointment. Patient is requesting Information regarding pap smear, Patient is not referring to results. Patient requesting call after 1pm.     Patient's last PE]: 10/08/2021     Requester's preferred ZO:[id number] on file     Patient available on portal:yes

## 2021-11-07 ENCOUNTER — Encounter (INDEPENDENT_AMBULATORY_CARE_PROVIDER_SITE_OTHER): Payer: Self-pay | Admitting: Registered Nurse

## 2021-11-09 LAB — COMPREHENSIVE METABOLIC PANEL
ALT: 10 IU/L (ref 0–32)
AST (SGOT): 20 IU/L (ref 0–40)
Albumin/Globulin Ratio: 2 (ref 1.2–2.2)
Albumin: 4.1 g/dL (ref 3.8–4.9)
Alkaline Phosphatase: 41 IU/L — ABNORMAL LOW (ref 44–121)
BUN / Creatinine Ratio: 16 (ref 9–23)
BUN: 15 mg/dL (ref 6–24)
Bilirubin, Total: 0.5 mg/dL (ref 0.0–1.2)
CO2: 26 mmol/L (ref 20–29)
Calcium: 8.9 mg/dL (ref 8.7–10.2)
Chloride: 106 mmol/L (ref 96–106)
Creatinine: 0.93 mg/dL (ref 0.57–1.00)
Globulin, Total: 2.1 g/dL (ref 1.5–4.5)
Glucose: 90 mg/dL (ref 70–99)
Potassium: 4 mmol/L (ref 3.5–5.2)
Protein, Total: 6.2 g/dL (ref 6.0–8.5)
Sodium: 142 mmol/L (ref 134–144)
eGFR: 73 mL/min/{1.73_m2} (ref >59)

## 2021-11-09 LAB — CBC AND DIFFERENTIAL
Baso(Absolute): 0.1 10*3/uL (ref 0.0–0.2)
Basophils Automated: 1 %
Eosinophils Absolute: 0.2 10*3/uL (ref 0.0–0.4)
Eosinophils Automated: 3 %
Hematocrit: 38.8 % (ref 34.0–46.6)
Hemoglobin: 12.6 g/dL (ref 11.1–15.9)
Immature Granulocytes Absolute: 0 10*3/uL (ref 0.0–0.1)
Immature Granulocytes: 0 %
Lymphocytes Absolute: 2 10*3/uL (ref 0.7–3.1)
Lymphocytes Automated: 44 %
MCH: 31.2 pg (ref 26.6–33.0)
MCHC: 32.5 g/dL (ref 31.5–35.7)
MCV: 96 fL (ref 79–97)
Monocytes Absolute: 0.3 10*3/uL (ref 0.1–0.9)
Monocytes: 5 %
Neutrophils Absolute Count: 2.2 10*3/uL (ref 1.4–7.0)
Neutrophils: 47 %
Platelets: 259 10*3/uL (ref 150–450)
RBC: 4.04 x10E6/uL (ref 3.77–5.28)
RDW: 12.5 % (ref 11.7–15.4)
WBC: 4.7 10*3/uL (ref 3.4–10.8)

## 2021-11-09 LAB — THYROID STIMULATING HORMONE (TSH), REFLEX ON ABNORMAL TO FREE T4, SERUM: TSH: 1.72 u[IU]/mL (ref 0.450–4.500)

## 2021-11-09 LAB — LIPID PANEL
Cholesterol / HDL Ratio: 2.3 ratio (ref 0.0–4.4)
Cholesterol: 162 mg/dL (ref 100–199)
HDL: 70 mg/dL (ref >39)
LDL Chol Calculated (NIH): 79 mg/dL (ref 0–99)
Triglycerides: 69 mg/dL (ref 0–149)
VLDL Calculated: 13 mg/dL (ref 5–40)

## 2021-11-09 LAB — HEMOGLOBIN A1C: Hemoglobin A1C: 5.5 % (ref 4.8–5.6)

## 2022-02-22 ENCOUNTER — Telehealth (INDEPENDENT_AMBULATORY_CARE_PROVIDER_SITE_OTHER): Payer: No Typology Code available for payment source | Admitting: Nurse Practitioner

## 2022-02-22 ENCOUNTER — Encounter (INDEPENDENT_AMBULATORY_CARE_PROVIDER_SITE_OTHER): Payer: Self-pay | Admitting: Nurse Practitioner

## 2022-02-22 DIAGNOSIS — T451X5A Adverse effect of antineoplastic and immunosuppressive drugs, initial encounter: Secondary | ICD-10-CM

## 2022-02-22 DIAGNOSIS — R232 Flushing: Secondary | ICD-10-CM

## 2022-02-22 DIAGNOSIS — C50411 Malignant neoplasm of upper-outer quadrant of right female breast: Secondary | ICD-10-CM

## 2022-02-22 DIAGNOSIS — K51019 Ulcerative (chronic) pancolitis with unspecified complications: Secondary | ICD-10-CM

## 2022-02-22 MED ORDER — CLONIDINE HCL 0.1 MG PO TABS
0.2000 mg | ORAL_TABLET | Freq: Every evening | ORAL | 0 refills | Status: AC
Start: 2022-02-22 — End: 2022-03-24

## 2022-02-22 MED ORDER — OZEMPIC (1 MG/DOSE) 4 MG/3ML SC SOPN
1.0000 mg | PEN_INJECTOR | SUBCUTANEOUS | 3 refills | Status: DC
Start: 2022-02-22 — End: 2022-08-21

## 2022-02-22 NOTE — Progress Notes (Signed)
Date of Virtual Visit: 02/22/2022 12:07 PM       Telemedicine Eligibility:    State Location:  [x]  [place]  []  [place]  [place]  [place] []  [place]  []  Other:  Physical Location:  [x]  Home  []  Work       []        []          []  Other:  Patient Identity Verification:  [x]  State Issued ID  []  Insurance Eligibility Check  []  Other:  Physical Address Verification: (for 911)  [x]  Yes  []  No  Personal identity shared with patient:  [x]  Yes  []  No  Education on nature of video visit shared with patient:  [x]  Yes  []  No  Emergency plan agreed upon with patient:  [x]  Yes  []  No  If the patient had not had this virtual visit, what would they have done?  []         []         []        []          []  Other:  Visit terminated since not appropriate for virtual care:  [x]  N/A  []  Reason:    Verbal consent has been obtained from the patient to conduct a virtual visit to minimize exposure to COVID-19: yes         Chief Complaint:    Chief Complaint   Patient presents with    Medication Refill         HPI:    Lindsey Cox is a 56 y.o. female who presents via telemedicine for follow up and medication refill. Has been taking ozempic weekly for the past 2 years for weight management and aides in UC bowel movements. Uses 1 mg weekly. Notes some nausea and needs to spread frequency to every 1.5 weeks, as needed. Pays out of pocket. [facility]     Requesting clonidine 0.2 mg nightly refill. Takes for hot flashes from side effects of tamoxifen therapy and menopausal symptoms. Was prescribed by prior GYN.    Requesting for tamoxifen refill for right breast cancer. Follows with oncology.            Problem List:    Patient Active Problem List   Diagnosis    Contact dermatitis due to chemicals    Lateral epicondylitis, right elbow    Lymphedema    Neuropathy due to chemotherapeutic drug    Other allergic rhinitis    Overweight (BMI 25.0-29.9)    Primary cancer of  upper outer quadrant of right female breast    Rib pain on right side    Tachycardia             Current Meds:      Current Outpatient Medications:     Semaglutide, 1 MG/DOSE, (Ozempic, 1 MG/DOSE,) 4 MG/3ML Solution Pen-injector, Inject 1 mg into the skin once a week, Disp: 6 mL, Rfl: 3    tamoxifen (NOLVADEX) 20 MG tablet, Take 20 mg by mouth daily, Disp: , Rfl:     Calcium-Magnesium-Vitamin D (CALCIUM MAGNESIUM PO), Take 1 tablet by mouth daily, Disp: , Rfl:     cetirizine (ZyrTEC) 10 MG tablet, Take 10 mg by mouth daily, Disp: , Rfl:     cloNIDine (CATAPRES) 0.1 MG tablet, Take 2 tablets (0.2 mg) by mouth nightly, Disp: 60 tablet, Rfl: 0        Allergies:    Allergies   Allergen  Reactions    Erythromycin Nausea And Vomiting    Isopropyl Alcohol Hives and Itching     WHEN PT GETS SHOTS- WIPE WITH RUBBING ALCOHOL, THEN WIPE THE RUBBING ALCOHOL OFF OF HER SKIN PRIOR TO ADMINISTERING THE SHOT  WHEN PT GETS SHOTS- WIPE WITH RUBBING ALCOHOL, THEN WIPE THE RUBBING ALCOHOL OFF OF HER SKIN PRIOR TO ADMINISTERING THE SHOT      Morphine And Related Hives, Itching and Swelling    Other Hives, Itching and Swelling     COPOLYMERS= GLUES, ADHESIVE, FOUND IN A LOT OF PRODUCTS  Copolymer      Tape Hives, Itching and Swelling    Azithromycin Itching and Nausea And Vomiting           Past Surgical History:    History reviewed. No pertinent surgical history.        Family History:    History reviewed. No pertinent family history.        Social History:    Social History     Tobacco Use    Smoking status: Never    Smokeless tobacco: Never   Vaping Use    Vaping status: Never Used   Substance Use Topics    Alcohol use: Yes     Comment: socially    Drug use: Never           The following sections were reviewed this encounter by the provider:   Tobacco  Allergies  Meds  Problems  Med Hx  Surg Hx  Fam Hx             Vital Signs:    There were no vitals taken for this visit.         ROS:    Review of Systems   Constitutional:   Negative for fatigue and fever.   HENT:  Negative for trouble swallowing.    Respiratory:  Negative for shortness of breath.    Cardiovascular:  Negative for chest pain.   Gastrointestinal:  Positive for nausea. Negative for vomiting.   Musculoskeletal:  Negative for neck pain.   Neurological:  Negative for dizziness.      As per HPI        Physical Exam:    Physical Exam  Constitutional:       General: Not in acute distress.     Appearance: Normal appearance.   HENT:      Head: Normocephalic and atraumatic.   Pulmonary:      Effort: Pulmonary effort is normal.      Comments: Speaking in full sentences without difficulty.  Neurological:      Mental Status: Alert and oriented to person, place, and time. Mental status is at baseline.   Psychiatric:         Mood and Affect: Mood normal.         Assessment:    1. Ulcerative pancolitis with complication  - Semaglutide, 1 MG/DOSE, (Ozempic, 1 MG/DOSE,) 4 MG/3ML Solution Pen-injector; Inject 1 mg into the skin once a week  Dispense: 6 mL; Refill: 3    2. Primary cancer of upper outer quadrant of right female breast    3. Hot flashes due to tamoxifen  - cloNIDine (CATAPRES) 0.1 MG tablet; Take 2 tablets (0.2 mg) by mouth nightly  Dispense: 60 tablet; Refill: 0          Plan:    Stable. Refilled medications. Aware of out of pocket costs. Educated unable [place].  Counseled may provide temp refills of clonidine, but recommend to follow up with GYN and oncologist for other medication refills. Given RTC precautions    Plan of care risks, benefits, side effects, and alternatives discussed with patient. Patient education and visit summary provided in MyChart and verbal form. Agreeable to plan of care and verbalized understanding of care, discharge and follow-up instructions. No further questions or concerns at this time.            Follow-up:    Return in about 6 months (around 08/24/2022), or if symptoms worsen or fail to improve.         Brylen Wagar Armond Hang, FNP       Total time was 25 minutes. That includes chart review before the visit, the actual patient visit, and time spent on documentation after the visit.     *DISCLAIMER: This note was generated by the Epic EMR system/ Dragon speech recognition and may contain inherent errors or omissions not intended by the user. Grammatical errors, random word insertions, deletions, pronoun errors and incomplete sentences are occasional consequences of this technology due to software limitations. Not all errors are caught or corrected. If there are questions or concerns about the content of this note or information contained within the body of this dictation they should be addressed directly with the author for clarification.*

## 2022-02-24 ENCOUNTER — Encounter (INDEPENDENT_AMBULATORY_CARE_PROVIDER_SITE_OTHER): Payer: Self-pay

## 2022-06-15 ENCOUNTER — Other Ambulatory Visit (INDEPENDENT_AMBULATORY_CARE_PROVIDER_SITE_OTHER): Payer: Self-pay | Admitting: Nurse Practitioner

## 2022-06-15 ENCOUNTER — Encounter (INDEPENDENT_AMBULATORY_CARE_PROVIDER_SITE_OTHER): Payer: Self-pay | Admitting: Registered Nurse

## 2022-06-15 DIAGNOSIS — R232 Flushing: Secondary | ICD-10-CM

## 2022-11-07 ENCOUNTER — Other Ambulatory Visit: Payer: Self-pay

## 2022-11-24 ENCOUNTER — Encounter (INDEPENDENT_AMBULATORY_CARE_PROVIDER_SITE_OTHER): Payer: Self-pay | Admitting: Emergency Medical Services

## 2022-11-24 ENCOUNTER — Ambulatory Visit (INDEPENDENT_AMBULATORY_CARE_PROVIDER_SITE_OTHER): Payer: BC Managed Care – PPO | Admitting: Emergency Medical Services

## 2022-11-24 VITALS — BP 100/60 | HR 88 | Temp 98.0°F | Resp 16 | Ht 64.25 in | Wt 142.0 lb

## 2022-11-24 DIAGNOSIS — C50411 Malignant neoplasm of upper-outer quadrant of right female breast: Secondary | ICD-10-CM

## 2022-11-24 DIAGNOSIS — Z131 Encounter for screening for diabetes mellitus: Secondary | ICD-10-CM

## 2022-11-24 DIAGNOSIS — Z8639 Personal history of other endocrine, nutritional and metabolic disease: Secondary | ICD-10-CM

## 2022-11-24 DIAGNOSIS — N939 Abnormal uterine and vaginal bleeding, unspecified: Secondary | ICD-10-CM | POA: Insufficient documentation

## 2022-11-24 DIAGNOSIS — Z13220 Encounter for screening for lipoid disorders: Secondary | ICD-10-CM

## 2022-11-24 DIAGNOSIS — K51019 Ulcerative (chronic) pancolitis with unspecified complications: Secondary | ICD-10-CM | POA: Insufficient documentation

## 2022-11-24 DIAGNOSIS — Z1159 Encounter for screening for other viral diseases: Secondary | ICD-10-CM

## 2022-11-24 DIAGNOSIS — N938 Other specified abnormal uterine and vaginal bleeding: Secondary | ICD-10-CM

## 2022-11-24 DIAGNOSIS — Z01818 Encounter for other preprocedural examination: Secondary | ICD-10-CM

## 2022-11-24 DIAGNOSIS — N898 Other specified noninflammatory disorders of vagina: Secondary | ICD-10-CM | POA: Insufficient documentation

## 2022-11-24 LAB — COMPREHENSIVE METABOLIC PANEL
ALT: 17 U/L (ref 0–55)
AST (SGOT): 26 U/L (ref 5–41)
Albumin/Globulin Ratio: 1.6 (ref 0.9–2.2)
Albumin: 4.1 g/dL (ref 3.5–5.0)
Alkaline Phosphatase: 40 U/L (ref 37–117)
Anion Gap: 10 (ref 5.0–15.0)
BUN: 14 mg/dL (ref 7.0–21.0)
Bilirubin, Total: 0.4 mg/dL (ref 0.2–1.2)
CO2: 24 mEq/L (ref 17–29)
Calcium: 8.9 mg/dL (ref 8.5–10.5)
Chloride: 110 mEq/L (ref 99–111)
Creatinine: 1 mg/dL (ref 0.4–1.0)
Globulin: 2.5 g/dL (ref 2.0–3.6)
Glucose: 101 mg/dL — ABNORMAL HIGH (ref 70–100)
Potassium: 3.8 mEq/L (ref 3.5–5.3)
Protein, Total: 6.6 g/dL (ref 6.0–8.3)
Sodium: 144 mEq/L (ref 135–145)
eGFR: >60 mL/min/{1.73_m2} (ref >=60)

## 2022-11-24 LAB — CBC AND DIFFERENTIAL
Absolute NRBC: 0 10*3/uL (ref 0.00–0.00)
Basophils Absolute Automated: 0.03 10*3/uL (ref 0.00–0.08)
Basophils Automated: 0.6 %
Eosinophils Absolute Automated: 0.06 10*3/uL (ref 0.00–0.44)
Eosinophils Automated: 1.2 %
Hematocrit: 36.9 % (ref 34.7–43.7)
Hgb: 12.5 g/dL (ref 11.4–14.8)
Immature Granulocytes Absolute: 0.01 10*3/uL (ref 0.00–0.07)
Immature Granulocytes: 0.2 %
Instrument Absolute Neutrophil Count: 2.6 10*3/uL (ref 1.10–6.33)
Lymphocytes Absolute Automated: 1.94 10*3/uL (ref 0.42–3.22)
Lymphocytes Automated: 38.9 %
MCH: 32.1 pg (ref 25.1–33.5)
MCHC: 33.9 g/dL (ref 31.5–35.8)
MCV: 94.9 fL (ref 78.0–96.0)
MPV: 9 fL (ref 8.9–12.5)
Monocytes Absolute Automated: 0.35 10*3/uL (ref 0.21–0.85)
Monocytes: 7 %
Neutrophils Absolute: 2.6 10*3/uL (ref 1.10–6.33)
Neutrophils: 52.1 %
Nucleated RBC: 0 /100 WBC (ref 0.0–0.0)
Platelets: 254 10*3/uL (ref 142–346)
RBC: 3.89 10*6/uL — ABNORMAL LOW (ref 3.90–5.10)
RDW: 12 % (ref 11–15)
WBC: 4.99 10*3/uL (ref 3.10–9.50)

## 2022-11-24 LAB — THYROID STIMULATING HORMONE (TSH) WITH REFLEX TO FREE T4: TSH, Abn Reflex to Free T4, Serum: 1.3 u[IU]/mL (ref 0.35–4.94)

## 2022-11-24 LAB — LIPID PANEL
Cholesterol / HDL Ratio: 3.2 Index
Cholesterol: 164 mg/dL (ref 0–199)
HDL: 51 mg/dL (ref 40–9999)
LDL Calculated: 85 mg/dL (ref 0–99)
Triglycerides: 138 mg/dL (ref 34–149)
VLDL Calculated: 28 mg/dL (ref 10–40)

## 2022-11-24 LAB — HEMOLYSIS INDEX(SOFT): Hemolysis Index: 12 Index (ref 0–24)

## 2022-11-24 LAB — HEPATITIS C ANTIBODY, TOTAL: Hepatitis C, AB: NONREACTIVE

## 2022-11-24 NOTE — Patient Instructions (Signed)
Thank you for choosing [facility] for your primary care needs. We strive to provide EXCELLENT care to you and your family. Below is a list of what we discussed during your visit. Continue with your efforts to take good care of your health. Good luck with your upcomming surgery and recovery.       Thank you!

## 2022-11-24 NOTE — Progress Notes (Signed)
Date of Exam: 11/24/2022 7:50 PM        Patient ID: Lindsey Cox is a 57 y.o. female.  Attending Physician: Nils Flack, FNP        Chief Complaint:    Chief Complaint   Patient presents with    Pre-op Exam               HPI:    HPI  Visit Type: Pre-operative Evaluation  Procedure: hysteroscopy   Date of Surgery: 12/02/21  Surgeon: Marilynne Halsted MD [facility] in [place] Number (Required): [phone]  Chief Complaint: none  Recent Health (admits): no current complaints  Recent Health (denies): fever, fatigue, chest pain, cough, nausea, vomiting, diarrhea, dyspnea, dysuria, urinary frequency, abdominal pain, easy bruising, LE swelling, and poor exercise tolerance  Exercise Tolerance: > 6 met ( i.e. shoveling snow )  Surgical Risk Factors: diabetes at goal  Prior Anesthesia: Patient reports adverse reaction to anesthesia in the past consisting of the following symptoms: nausea          Problem List:    Patient Active Problem List   Diagnosis    Contact dermatitis due to chemicals    Lateral epicondylitis, right elbow    Lymphedema    Neuropathy due to chemotherapeutic drug    Other allergic rhinitis    Overweight (BMI 25.0-29.9)    Primary cancer of upper outer quadrant of right female breast    Rib pain on right side    Tachycardia    Abnormal uterine bleeding    Vaginal dryness    Ulcerative pancolitis with complication             Current Meds:    No outpatient medications have been marked as taking for the 11/24/22 encounter (Office Visit) with Nils Flack, FNP.          Allergies:    Allergies   Allergen Reactions    Erythromycin Nausea And Vomiting    Isopropyl Alcohol Hives and Itching     WHEN PT GETS SHOTS- WIPE WITH RUBBING ALCOHOL, THEN WIPE THE RUBBING ALCOHOL OFF OF HER SKIN PRIOR TO ADMINISTERING THE SHOT  WHEN PT GETS SHOTS- WIPE WITH RUBBING ALCOHOL, THEN WIPE THE RUBBING ALCOHOL OFF OF HER SKIN PRIOR TO ADMINISTERING THE SHOT      Morphine And Related  Hives, Itching and Swelling    Other Hives, Itching and Swelling     COPOLYMERS= GLUES, ADHESIVE, FOUND IN A LOT OF PRODUCTS  Copolymer      Tape Hives, Itching and Swelling    Azithromycin Itching and Nausea And Vomiting             Past Surgical History:    No past surgical history on file.        Family History:    No family history on file.        Social History:    Social History     Tobacco Use    Smoking status: Never    Smokeless tobacco: Never   Vaping Use    Vaping Use: Never used   Substance Use Topics    Alcohol use: Yes     Comment: socially    Drug use: Never           The following sections were reviewed this encounter by the provider:            Vital Signs:    BP 100/60 (BP Site: Left arm, Patient Position:  Sitting, Cuff Size: Medium)   Pulse 88   Temp 98 F (36.7 C) (Temporal)   Resp 16   Ht 1.632 m (5' 4.25")   Wt 64.4 kg (142 lb)   BMI 24.18 kg/m          ROS:    Review of Systems   General/Constitutional:           Denies Chills.  Denies Fatigue.  Denies Fever.       Ophthalmologic:           Denies Blurred vision.       ENT:           Denies Nasal Discharge.  Denies Sinus pain.  Denies Sore throat.       Endocrine:           Denies Polydipsia.  Denies Polyuria.       Respiratory:           Denies Cough.  Denies Orthopnea.  Denies Shortness of breath.  Denies Wheezing.       Cardiovascular:           Denies Chest pain.  Denies Chest pain with exertion.  Denies Palpitations.  Denies Swelling in hands/feet.       Gastrointestinal:           Denies Abdominal pain.  Denies Constipation.  Denies Diarrhea.  Denies Nausea.  Denies Vomiting.       Hematology:           Denies Easy bruising.  Denies Easy Bleeding.       Genitourinary:           Denies Blood in urine.  Denies Painful urination.       Peripheral Vascular:           Denies Pain/cramping in legs after exertion.  Denies Painful extremities.       Skin:           Denies Rash.       Neurologic:           Denies Dizziness.  Denies  Pre-Syncope.  Denies Tingling/Numbness.       Psychiatric:           Denies Anxiety.  Denies Depressed mood.             Physical Exam:    Physical Exam   GENERAL APPEARANCE: alert, in no acute distress, well developed, well nourished, oriented to time, place, and person.   HEAD: normal appearance.   EYES: extraocular movement intact (EOMI), pupils equal, round, reactive to light and accommodation, sclera anicteric.   EARS: tympanic membranes normal bilaterally, external canals normal .   NOSE: normal nasal mucosa, nares patent.   ORAL CAVITY: normal oropharynx.   THROAT: no erythema, no exudate.   NECK/THYROID: neck supple, carotid pulse 2+ bilaterally, no lymphadenopathy, no thyromegaly.   SKIN: no rashes.   HEART: S1, S2 normal, no murmurs, rubs, gallops, regular rate and rhythm.   LUNGS: normal effort / no distress, normal breath sounds, clear to auscultation bilaterally, no wheezes, rales, rhonchi.   ABDOMEN: bowel sounds present, no hepatosplenomegaly, soft, nontender, nondistended.   EXTREMITIES: no clubbing, cyanosis, or edema.   PERIPHERAL PULSES: 2+ dorsalis pedis, 2+ posterior tibial.   NEUROLOGIC: nonfocal, cranial nerves 2-12 grossly intact, deep tendon reflexes 2+ symmetrical, normal strength, tone and reflexes, sensory exam intact.   PSYCH: alert, oriented, cognitive function intact.        Assessment:  1. Diabetes mellitus screening  - Comprehensive metabolic panel    2. Lipid screening  - Lipid panel    3. Encounter for HCV screening test for low risk patient  - Hepatitis C (HCV) antibody, Total    4. Pre-op examination  - CBC and differential  - Comprehensive metabolic panel    5. History of diet-controlled diabetes  - TSH, Abn Reflex to Free T4, Serum            Plan:    Preoperative Evaluation:  Surgery Specific Risk:  Low (endoscopic, superficial, breast, opthalmologic, minor orthopedic)  ASA Classification: Class 1 - Healthy patient.    Preoperative Status:  There are no active  cardiopulmonary conditions. Exercise tolerance is greater than 4 mets.     Recommendations:  Pt is has no active cardiovascular symptoms and has good functional capacity.  Pt cleared for surgery with acceptable risk. Pt does have the ability to perform > 4 MET levels of activity, has no active cardiac conditions and may proceed with surgery with no additional cardiac testing or procedures.  - future lab orders placed. Patient advised to schedule a routine annual physical exam.            Follow-up:    No follow-ups on file.         Nils Flack, FNP

## 2022-11-25 ENCOUNTER — Telehealth (INDEPENDENT_AMBULATORY_CARE_PROVIDER_SITE_OTHER): Payer: Self-pay | Admitting: Emergency Medical Services

## 2022-11-25 NOTE — Progress Notes (Signed)
Pre-op labs have been reviewed.

## 2022-11-25 NOTE — Telephone Encounter (Signed)
Please fax all pre-op labs to the patients Surgeon at the fax number listed in my progress note.

## 2022-11-28 NOTE — Telephone Encounter (Signed)
LPN epic routed all pre-op labs to the patients Surgeon at the fax number listed in my progress note.      Fax Number : [phone]

## 2022-12-12 ENCOUNTER — Telehealth (INDEPENDENT_AMBULATORY_CARE_PROVIDER_SITE_OTHER): Payer: Self-pay | Admitting: Emergency Medical Services

## 2022-12-12 ENCOUNTER — Encounter (INDEPENDENT_AMBULATORY_CARE_PROVIDER_SITE_OTHER): Payer: Self-pay | Admitting: Registered Nurse

## 2022-12-12 ENCOUNTER — Other Ambulatory Visit (INDEPENDENT_AMBULATORY_CARE_PROVIDER_SITE_OTHER): Payer: Self-pay | Admitting: Registered Nurse

## 2022-12-12 DIAGNOSIS — E663 Overweight: Secondary | ICD-10-CM

## 2022-12-12 MED ORDER — SEMAGLUTIDE (1 MG/DOSE) 4 MG/3ML SC SOPN
1.0000 mg | PEN_INJECTOR | SUBCUTANEOUS | 0 refills | Status: DC
Start: 2022-12-12 — End: 2022-12-30

## 2022-12-12 NOTE — Telephone Encounter (Signed)
Name of Requester: Patient     Name and Dosage of Rx: Semaglutide, 1 MG/DOSE, (Ozempic, 1 MG/DOSE,) 4 MG/3ML Solution Pen-injector    [Rx Refill Request:]Per patient, she discussed Ozempic medication during last visit. Patient does not use insurance for medication. She pays out of pocket for medication.Patient was advised she may need to schedule appointment for refill.   Patient requesting call back if appointment is needed for refill.     Please advise if ok to refill or patient needs appointment.     Days left of Rx:0    Preferred Pharmacy name:[facility] [place]    Preferred Pharmacy ph: [phone]     Date of Patient's Last - PRE OP]: 11/24/2022    Requester's preferred JR:[email or web address] on file     Patient available on portal: Yes     Please note:

## 2022-12-29 NOTE — Progress Notes (Signed)
[id number]  [place], [place] [address]  Ph. [phone], Joylene Igo [id number]                Date of Exam: 12/30/2022 12:26 PM        Patient ID: Lindsey Cox is a 57 y.o. female.  Attending Physician: Lambert Keto, DNP FNP        Chief Complaint:    Chief Complaint   Patient presents with    Annual Exam     Fasting : yes, but already had bloodwork a few weeks ago (pt would like orders to test if she is in menopause, had orders from a doctor Dr. Betha Loa - [facility])  Colonoscopy : never  Pap : 09/2021, normal  Mammo : 11/02/2022, normal                 HPI:      Pt is 57  yo female presents today for a wellness exam as well as to address several separate and identifiable problems independent of wellness exam, meeting 25 modifier criteria.   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 Wyvonne Lenz, Oncologist   Dr. Betha Loa, perfectly female       Postmenopausal symptoms:  Takes CLONIDINE 0.2 MG for hot flashes.   BP is 82/58 in the office   Patient requesting to change clonidine  Because she feels dizzy when she gets up in the morning  Briefly discussed SSRI  Recommended to follow up with the OB gyn  Patient agreeable    Ulcerative colitis as a child-   colectomy in 2000- has a J pouch.  Doesn't follow up with the GI any more (not in 10-13 years)   Did colo guard in the past   Agreeable to get the colonoscopy   Tried to get an appt with the GI (Dr. Maurice March) and couldn't get an appt   Patient seeking a referral to Dr. Stanford Breed    Weight management:   BMI  23.82 today  Takes Ozempic 1 mg per week to maintain weight  pays out of pocket  Gets it filled from a pharmacy in [place] "its almost 1/3 the price of what it is here"    has helped her with Bowel movements as well  " Its a medicine that saves my life"   Requesting one year supply and a paper copy of the prescription     Refills needed today: Semaglutide   Pharmacy: [facility] [place] [facility] [phone]    Depression screen:   PHQ-2/9 score: 1  No concerns for SI or HI: none     Vaccines:  - TDAP: doesn't think she had it in the last 10 years, declined today   - FLU: 09/2022  - COVID: 4/5  - Shingrix: thinks that she had a shingles vaccine from her PCP: will send records     Regular screening:  Last pap smear:  09/2021: normal cytology and HPV  History of  abnormal pap smear:  none   LMP:  post menopausal   Last mammogram: 10/2022   Last colonoscopy: never agreeable   Advanced Directives: yes, requested a copy.  Patient reports that her mom is the POA  Last eye exam: 10/2022   Derm: none   Last dental exam: every 6 months   Exercise: 4-5 days a week  Diet: healthy   Caffeine:teas   Sleep: 7-8 hrs, wakes up at 3 am, take gummies every once in a while   Smoking: none  Alcohol: occasional   Sexually active: not currently   Any concerns for STI: none           Problem List:    Patient Active Problem List   Diagnosis    Contact dermatitis due to chemicals    Lateral epicondylitis, right elbow    Lymphedema    Neuropathy due to chemotherapeutic drug    Other allergic rhinitis    Overweight (BMI 25.0-29.9)    Primary cancer of upper outer quadrant of right female breast    Rib pain on right side    Tachycardia    Abnormal uterine bleeding    Vaginal dryness    Ulcerative pancolitis with complication             Current Meds:      Current Outpatient Medications:     Calcium-Magnesium-Vitamin D (CALCIUM MAGNESIUM PO), Take 1 tablet by mouth daily, Disp: , Rfl:     cetirizine (ZyrTEC) 10 MG  tablet, Take 1 tablet (10 mg) by mouth daily, Disp: , Rfl:     tamoxifen (NOLVADEX) 20 MG tablet, Take 1 tablet (20 mg) by mouth daily, Disp: , Rfl:     semaglutide (OZEMPIC) 4 MG/3ML, Inject 1 mg into the skin once a week Patient to pay out of pocket, no prior authorization, Disp: 9 mL, Rfl: 3        Allergies:    Allergies   Allergen Reactions    Erythromycin Nausea And Vomiting    Isopropyl Alcohol Hives and Itching     WHEN PT GETS SHOTS- WIPE WITH RUBBING ALCOHOL, THEN WIPE THE RUBBING ALCOHOL OFF OF HER SKIN PRIOR TO ADMINISTERING THE SHOT  WHEN PT GETS SHOTS- WIPE WITH RUBBING ALCOHOL, THEN WIPE THE RUBBING ALCOHOL OFF OF HER SKIN PRIOR TO ADMINISTERING THE SHOT      Morphine And Related Hives, Itching and Swelling    Other Hives, Itching and Swelling     COPOLYMERS= GLUES, ADHESIVE, FOUND IN A LOT OF PRODUCTS  Copolymer      Tape Hives, Itching and Swelling    Azithromycin Itching and Nausea And Vomiting          History reviewed. No pertinent past medical history.     Past Surgical History:    History reviewed. No pertinent surgical history.        Family History:    History reviewed. No pertinent family history.        Social History:    Social History     Tobacco Use    Smoking status: Never    Smokeless tobacco: Never   Vaping Use    Vaping Use: Never used   Substance Use Topics    Alcohol use: Yes     Comment: socially    Drug use: Yes     Types: Marijuana     Comment: gummy occasionally           The following sections were reviewed  this encounter by the provider:   Tobacco  Allergies  Meds  Problems  Med Hx  Surg Hx  Fam Hx             Vital Signs:    BP (!) 82/58 (BP Site: Right arm, Patient Position: Sitting, Cuff Size: Medium)   Pulse 80   Temp 97.6 F (36.4 C) (Temporal)   Resp 16   Ht 1.626 m (5' 4"$ )   Wt 63 kg (138 lb 12.8 oz)   BMI 23.82 kg/m          ROS:    Review of Systems   Constitutional:  Negative for activity change, fatigue and unexpected weight change.   HENT:  Negative  for ear pain, hearing loss, sinus pain and trouble swallowing.    Eyes:  Negative for pain and visual disturbance.   Respiratory:  Negative for chest tightness and shortness of breath.    Cardiovascular:  Negative for chest pain, palpitations and leg swelling.   Gastrointestinal:  Negative for abdominal pain, blood in stool, constipation, diarrhea, nausea and vomiting.   Endocrine: Negative for cold intolerance, heat intolerance, polydipsia and polyuria.   Genitourinary:  Negative for difficulty urinating, menstrual problem and pelvic pain.   Musculoskeletal:  Negative for arthralgias and joint swelling.   Skin:  Negative for color change and rash.   Neurological:  Negative for dizziness, tremors, speech difficulty, weakness, light-headedness, numbness and headaches.   Hematological:  Negative for adenopathy. Does not bruise/bleed easily.   Psychiatric/Behavioral:  Negative for dysphoric mood and suicidal ideas.    All other systems reviewed and are negative.             Physical Exam:    Physical Exam  Vitals reviewed.   Constitutional:       General: She is not in acute distress.     Appearance: Normal appearance. She is not ill-appearing.   HENT:      Head: Normocephalic and atraumatic.      Right Ear: Tympanic membrane, ear canal and external ear normal.      Left Ear: Tympanic membrane, ear canal and external ear normal.      Nose: Nose normal.      Mouth/Throat:      Mouth: Mucous membranes are moist. No oral lesions.   Eyes:      General: Lids are normal.      Extraocular Movements: Extraocular movements intact.      Right eye: No nystagmus.      Left eye: No nystagmus.      Pupils: Pupils are equal, round, and reactive to light.   Neck:      Thyroid: No thyroid mass, thyromegaly or thyroid tenderness.      Vascular: No carotid bruit.   Cardiovascular:      Rate and Rhythm: Normal rate and regular rhythm.      Pulses:           Radial pulses are 2+ on the right side and 2+ on the left side.        Dorsalis  pedis pulses are 2+ on the right side and 2+ on the left side.      Heart sounds: Normal heart sounds.   Pulmonary:      Effort: Pulmonary effort is normal. No respiratory distress.      Breath sounds: Normal breath sounds and air entry. No decreased breath sounds, wheezing, rhonchi or rales.   Abdominal:  General: Abdomen is flat. Bowel sounds are normal. There is no distension.      Palpations: Abdomen is soft. There is no hepatomegaly, splenomegaly or mass.      Tenderness: There is no abdominal tenderness.      Hernia: There is no hernia in the umbilical area or ventral area.   Genitourinary:     Comments: Discussion today with pt  No issues or concerns  Pt and I have decided to defer exam  Will followup if any issues at anytime    Musculoskeletal:      Cervical back: Neck supple.      Right lower leg: No edema.      Left lower leg: No edema.   Lymphadenopathy:      Cervical: No cervical adenopathy.   Skin:     General: Skin is warm and dry.      Findings: No lesion.      Nails: There is no clubbing.   Neurological:      General: No focal deficit present.      Mental Status: She is alert.      Motor: No tremor.      Deep Tendon Reflexes: Reflexes are normal and symmetric.   Psychiatric:         Behavior: Behavior is cooperative.         Thought Content: Thought content normal.                Assessment:    1. Well adult exam    2. Colon cancer screening  - Referral to Gastroenterology ([facility]); Future    3. History of diet-controlled diabetes    4. Ulcerative pancolitis with complication  - Referral to Gastroenterology ([facility]); Future    5. Primary cancer of upper outer quadrant of right female breast    6. Lymphedema    7. Neuropathy due to chemotherapeutic drug    8. Overweight (BMI 25.0-29.9)  - semaglutide (OZEMPIC) 4 MG/3ML; Inject 1 mg into the skin once a week Patient to pay out of pocket, no prior authorization  Dispense: 9 mL; Refill: 3    9. Post menopausal problems            Plan:    General  Wellness:   Continue to see GYN for breast exams, mammograms, and pap smears   Encouraged to regular exercise at least 3-4 times per week for 30-45 minutes per session of aerobic exercise  Encouraged healthy diet with 5-9 servings of fruits and vegetables per day  Encouraged dental exam every 6-12 months  Encouraged eye exam every 1-2 years  Encouraged adequate daily H2O intake  Education provided regarding safe sun practices and healthy skin care  Encouraged patient to send [place] a copy of her advanced directives  As posted in each exam room now that during a well physical- Any new acute health problems, uncontrolled chronic medical conditions, or office procedures that is done today are not considered part of the physical and that an additional copayment or deductible may apply.  Thank you for understanding  Continue regular follow-ups with your specialists   proper use and potential side effects of medications were discussed with patient    Postmenopausal symptoms/low blood pressure  Recommended to follow-up with Dr. Mertha Baars for further evaluation and management of postmenopausal symptoms and discussion about switching clonidine to a different medication.    Recommend dressing in layers so you can take off clothes if you get hot, Keep your home at  a comfortable temperature, Avoid hot drinks, such as coffee and tea,Put a cold, wet washcloth against your neck during hot flashes, Quit smoking, if you smoke, (Smoking makes hot flashes worse.)   Use a vaginal moisturizer a few times a week (sample brand names: Replens, K-Y SILK-E).  Use a lubricant before sex (sample brand names: Astroglide, K-Y Jelly).  Try to go to sleep and get up at the same time every day, even when you don't sleep well.  Avoid caffeine in the afternoon, and limit alcohol.  Try to stay active. Exercise can help your mood.  Seek support from other people going through menopause.     Ulcerative colitis  Referred for screening colonoscopy- advised  patient to schedule an appt with the gastroenterologist - (referral placed for Dr. Stanford Breed)     Weight management:   Refille semaglutide today for a year  Printed copy of the semaglutide prescription provided per patient request    Vaccines:   Declined Tdap today.  Encouraged   Patient to send the shingles vaccine records  ALSO, if you have not, please get the current Covid-19 booster- Go to any local pharmacy or grocery store -- you may be able to even schedule online    All questions answered. Patient agreeable and amenable to plan. Patient verbalizes understanding.          Follow-up:    Return in about 1 year (around 12/31/2023) for Annual exam or sooner as needed.         Lambert Keto, DNP FNP

## 2022-12-30 ENCOUNTER — Encounter (INDEPENDENT_AMBULATORY_CARE_PROVIDER_SITE_OTHER): Payer: BC Managed Care – PPO | Admitting: Registered Nurse

## 2022-12-30 ENCOUNTER — Ambulatory Visit (INDEPENDENT_AMBULATORY_CARE_PROVIDER_SITE_OTHER): Payer: BC Managed Care – PPO | Admitting: Registered Nurse

## 2022-12-30 ENCOUNTER — Encounter (INDEPENDENT_AMBULATORY_CARE_PROVIDER_SITE_OTHER): Payer: Self-pay | Admitting: Registered Nurse

## 2022-12-30 ENCOUNTER — Other Ambulatory Visit: Payer: Self-pay

## 2022-12-30 VITALS — BP 82/58 | HR 80 | Temp 97.6°F | Resp 16 | Ht 64.0 in | Wt 138.8 lb

## 2022-12-30 DIAGNOSIS — Z0000 Encounter for general adult medical examination without abnormal findings: Secondary | ICD-10-CM

## 2022-12-30 DIAGNOSIS — C50411 Malignant neoplasm of upper-outer quadrant of right female breast: Secondary | ICD-10-CM

## 2022-12-30 DIAGNOSIS — Z1211 Encounter for screening for malignant neoplasm of colon: Secondary | ICD-10-CM

## 2022-12-30 DIAGNOSIS — Z1159 Encounter for screening for other viral diseases: Secondary | ICD-10-CM

## 2022-12-30 DIAGNOSIS — N959 Unspecified menopausal and perimenopausal disorder: Secondary | ICD-10-CM

## 2022-12-30 DIAGNOSIS — K51019 Ulcerative (chronic) pancolitis with unspecified complications: Secondary | ICD-10-CM

## 2022-12-30 DIAGNOSIS — I890 Lymphedema, not elsewhere classified: Secondary | ICD-10-CM

## 2022-12-30 DIAGNOSIS — Z8639 Personal history of other endocrine, nutritional and metabolic disease: Secondary | ICD-10-CM

## 2022-12-30 DIAGNOSIS — Z124 Encounter for screening for malignant neoplasm of cervix: Secondary | ICD-10-CM

## 2022-12-30 DIAGNOSIS — Z13220 Encounter for screening for lipoid disorders: Secondary | ICD-10-CM

## 2022-12-30 DIAGNOSIS — T451X5A Adverse effect of antineoplastic and immunosuppressive drugs, initial encounter: Secondary | ICD-10-CM

## 2022-12-30 DIAGNOSIS — E663 Overweight: Secondary | ICD-10-CM

## 2022-12-30 DIAGNOSIS — Z1231 Encounter for screening mammogram for malignant neoplasm of breast: Secondary | ICD-10-CM

## 2022-12-30 DIAGNOSIS — G620 Drug-induced polyneuropathy: Secondary | ICD-10-CM

## 2022-12-30 MED ORDER — SEMAGLUTIDE (1 MG/DOSE) 4 MG/3ML SC SOPN
1.0000 mg | PEN_INJECTOR | SUBCUTANEOUS | 3 refills | Status: DC
Start: 2022-12-30 — End: 2024-01-08

## 2022-12-30 NOTE — Patient Instructions (Signed)
Plan:    General Wellness:   Continue to see GYN for breast exams, mammograms, and pap smears   Encouraged to regular exercise at least 3-4 times per week for 30-45 minutes per session of aerobic exercise  Encouraged healthy diet with 5-9 servings of fruits and vegetables per day  Encouraged dental exam every 6-12 months  Encouraged eye exam every 1-2 years  Encouraged adequate daily H2O intake  Education provided regarding safe sun practices and healthy skin care  Encouraged patient to send [place] a copy of her advanced directives  As posted in each exam room now that during a well physical- Any new acute health problems, uncontrolled chronic medical conditions, or office procedures that is done today are not considered part of the physical and that an additional copayment or deductible may apply.  Thank you for understanding  Continue regular follow-ups with your specialists   proper use and potential side effects of medications were discussed with patient    Postmenopausal symptoms/low blood pressure  Recommended to follow-up with Dr. Mertha Baars for further evaluation and management of postmenopausal symptoms and discussion about switching clonidine to a different medication.    Recommend dressing in layers so you can take off clothes if you get hot, Keep your home at a comfortable temperature, Avoid hot drinks, such as coffee and tea,Put a cold, wet washcloth against your neck during hot flashes, Quit smoking, if you smoke, (Smoking makes hot flashes worse.)   Use a vaginal moisturizer a few times a week (sample brand names: Replens, K-Y SILK-E).  Use a lubricant before sex (sample brand names: Astroglide, K-Y Jelly).  Try to go to sleep and get up at the same time every day, even when you don't sleep well.  Avoid caffeine in the afternoon, and limit alcohol.  Try to stay active. Exercise can help your mood.  Seek support from other people going through menopause.     Ulcerative colitis  Referred for screening  colonoscopy- advised patient to schedule an appt with the gastroenterologist - (referral placed for Dr. Stanford Breed)     Weight management:   Refille semaglutide today for a year  Printed copy of the semaglutide prescription provided per patient request    Vaccines:   Declined Tdap today.  Encouraged   Patient to send the shingles vaccine records  ALSO, if you have not, please get the current Covid-19 booster- Go to any local pharmacy or grocery store -- you may be able to even schedule online          Follow-up:    Return in about 1 year (around 12/31/2023) for Annual exam or sooner as needed.

## 2023-01-03 ENCOUNTER — Encounter (INDEPENDENT_AMBULATORY_CARE_PROVIDER_SITE_OTHER): Payer: Self-pay | Admitting: Registered Nurse

## 2023-05-04 ENCOUNTER — Encounter (INDEPENDENT_AMBULATORY_CARE_PROVIDER_SITE_OTHER): Payer: Self-pay

## 2023-09-28 ENCOUNTER — Telehealth (INDEPENDENT_AMBULATORY_CARE_PROVIDER_SITE_OTHER): Payer: BC Managed Care – PPO | Admitting: Registered Nurse

## 2023-09-28 ENCOUNTER — Encounter (INDEPENDENT_AMBULATORY_CARE_PROVIDER_SITE_OTHER): Payer: Self-pay | Admitting: Registered Nurse

## 2023-09-28 DIAGNOSIS — R110 Nausea: Secondary | ICD-10-CM

## 2023-09-28 DIAGNOSIS — R519 Headache, unspecified: Secondary | ICD-10-CM

## 2023-09-28 DIAGNOSIS — U099 Post covid-19 condition, unspecified: Secondary | ICD-10-CM

## 2023-09-28 MED ORDER — LORAZEPAM 1 MG PO TABS
1.0000 mg | ORAL_TABLET | Freq: Three times a day (TID) | ORAL | 0 refills | Status: AC | PRN
Start: 2023-09-28 — End: ?

## 2023-09-28 NOTE — Patient Instructions (Signed)
 Plan:    Long COVID/nausea/HA  PMP reviewed  10 tablets of lorazepam sent  Advised that prescriptions cannot be replaced if lost or stolen.  Advised that medication should only be prescribed by one provider.  Follow up in 3 months for annual exam/controll

## 2023-09-28 NOTE — Progress Notes (Signed)
 [address]  [place], [place] [address]  Ph. [phone], Fax [phone]            Date of Virtual Visit: 09/28/2023 3:45 PM        Patient ID: Lindsey Cox is a 57 y.o. female.  Attending Physician: Laurence Spates, DNP FNP

## 2023-11-23 ENCOUNTER — Ambulatory Visit (INDEPENDENT_AMBULATORY_CARE_PROVIDER_SITE_OTHER): Payer: BC Managed Care – PPO | Admitting: Emergency Medical Services

## 2023-11-23 VITALS — BP 112/68 | HR 88 | Temp 98.2°F | Resp 18 | Ht 64.5 in | Wt 149.0 lb

## 2023-11-23 DIAGNOSIS — C50411 Malignant neoplasm of upper-outer quadrant of right female breast: Secondary | ICD-10-CM

## 2023-11-23 DIAGNOSIS — J324 Chronic pansinusitis: Secondary | ICD-10-CM

## 2023-11-23 DIAGNOSIS — K51019 Ulcerative (chronic) pancolitis with unspecified complications: Secondary | ICD-10-CM

## 2023-11-23 DIAGNOSIS — T451X5A Adverse effect of antineoplastic and immunosuppressive drugs, initial encounter: Secondary | ICD-10-CM

## 2023-11-23 DIAGNOSIS — G620 Drug-induced polyneuropathy: Secondary | ICD-10-CM

## 2023-11-23 MED ORDER — AMOXICILLIN 875 MG PO TABS
875.0000 mg | ORAL_TABLET | Freq: Two times a day (BID) | ORAL | 0 refills | Status: AC
Start: 2023-11-23 — End: 2023-12-03

## 2023-11-23 NOTE — Progress Notes (Signed)
[address]  [place], [place] [address]  Ph. [phone], Fax [phone]   Patient ID: Lindsey Cox is a 58 y.o. female.  Attending Physician: Nils Flack, FNP     Date of Exam: 11/23/2023 1:33 PM    Chief Complaint   Patient presents with    Nasal Congestion     Cough, lost voice for 8 days         HPI:    Patient is a 58 y.o. female presenting in the office for URI symptoms with hoarseness and loss of voice. Onset 14 days ago. With cold, heaviness, nasal congestion. No headaches, sore throat or fevers.  Then progressively continued until she lost her voice. Symptoms slightly better. Still heaviness in her head. 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   Declines tdap today.  Will upload shingles vaccine records.     Taking zyrtec on occasion---gets pressure headaches.             Problem List:    Problem List[1]      Current Meds:    Current Medications[2]         Allergies:    Allergies[3]        Past Surgical History:    Past Surgical History[4]        Family History:    Family History[5]        Social History:    Social History[6]        The following sections were reviewed this encounter by the provider:              ROS:    As stated in HPI above.        Physical Exam:    BP 112/68 (BP Site: Right arm, Patient Position: Sitting, Cuff Size: Medium)   Pulse 88   Temp 98.2 F (36.8 C) (Tympanic)   Resp 18   Ht 1.638 m (5' 4.5")   Wt 67.6 kg (149 lb)   BMI 25.18 kg/m      Physical Exam  Vitals reviewed.   Constitutional:       Appearance: She is not toxic-appearing.   HENT:      Right Ear: Tympanic membrane normal.      Left Ear: Tympanic membrane normal.      Nose: Rhinorrhea present.      Mouth/Throat:      Pharynx: Oropharynx is clear.   Eyes:      Extraocular Movements: Extraocular movements intact.      Conjunctiva/sclera: Conjunctivae normal.   Cardiovascular:      Rate and Rhythm: Normal rate.   Pulmonary:      Effort: Pulmonary effort is  normal.      Breath sounds: No wheezing or rhonchi.   Musculoskeletal:      Cervical back: Normal range of motion and neck supple.      Comments: Movements appear to be normal and symmetric     Neurological:      Mental Status: She is alert and oriented to person, place, and time.   Psychiatric:         Attention and Perception: Attention normal.         Behavior: Behavior normal.              Assessment:    1. Chronic pansinusitis  - amoxicillin (AMOXIL) 875 MG tablet; Take 1 tablet (875 mg) by mouth 2 (two) times daily for 10 days  Dispense: 20 tablet; Refill: 0    2. Neuropathy due to chemotherapeutic drug    3. Ulcerative pancolitis with complication    4. Primary cancer of upper outer quadrant of right female breast          Plan:    Sinusitis:  For symptoms relief, the following OTC medications may be used:  Acetaminophen (Tylenol) 1000 mg, take twice a day, as needed for fevers or pain.  Ibuprofen (Motrin) 600 mg, take three times a day, as needed for fevers or pain. Take with food.  Mucinex (Guaifenesin) 1200 mg every 12 hours as needed for chest congestion, as it thins and loosens mucus.  Nasal steroid (Flonase, Rhinocort, Nasocort) two sprays in each nostril daily for two weeks as needed for nasal congestion.   Antihistamine (Claritin, Zyrtec) daily as needed for congestion  Implement supportive care measures:  Optimize hydration  Nasal saline rinses twice a day, as needed for nasal congestion.  Salt water gargles 4+ times a day, as needed for sore throat.  Inhale hot steam showers or use humidifiers 3-4x a day.  Warm compresses over sinus area 3-4x a day as needed.  Vitamin C 500 mg twice a day (oxidative support).  Avoid close contact and practice good hand hygiene.  Proper use and potential side effects of medications were discussed with patient      Plan of care risks, benefits, side effects, and alternative discussed with patient. Patient education and visit summary provided in MyChart and verbal  form. Agreeable to plan of care and verbalized understanding of care, discharge and follow-up instructions. No further questions or concerns at this time.           Follow-up:    No follow-ups on file.            Royston Sinner, MSN, ACNP-BC, FNP-C  Nurse Practitioner  [facility]   [facility]          *DISCLAIMER: This note was generated by the Epic EMR system/ Dragon speech recognition and may contain inherent errors or omissions not intended by the user. Grammatical errors, random word insertions, deletions, pronoun errors and incomplete sentences are occasional consequences of this technology due to software limitations. Not all errors are caught or corrected. If there are questions or concerns about the content of this note or information contained within the body of this dictation they should be addressed directly with the author for clarification.*       [1]   Patient Active Problem List  Diagnosis    Contact dermatitis due to chemicals    Lateral epicondylitis, right elbow    Lymphedema    Neuropathy due to chemotherapeutic drug    Other allergic rhinitis    Overweight (BMI 25.0-29.9)    Primary cancer of upper outer quadrant of right female breast    Rib pain on right side    Tachycardia    Abnormal uterine bleeding    Vaginal dryness    Ulcerative pancolitis with complication   [2]   Current Outpatient Medications   Medication Sig Dispense Refill    Calcium-Magnesium-Vitamin D (CALCIUM MAGNESIUM PO) Take 1 tablet by mouth daily      cetirizine (ZyrTEC) 10 MG tablet Take 1 tablet (10 mg) by mouth daily      cloNIDine (CATAPRES) 0.1 MG tablet       LORazepam (ATIVAN) 1 MG tablet Take 1 tablet (1 mg) by mouth every 8 (eight) hours as needed (nausea)  10 tablet 0    semaglutide (OZEMPIC) 4 MG/3ML Inject 1 mg into the skin once a week Patient to pay out of pocket, no prior authorization 9 mL 3    amoxicillin (AMOXIL) 875 MG tablet Take 1 tablet (875 mg) by mouth 2 (two) times daily for 10  days 20 tablet 0    tamoxifen (NOLVADEX) 20 MG tablet Take 1 tablet (20 mg) by mouth daily (Patient not taking: Reported on 11/23/2023)       No current facility-administered medications for this visit.   [3]   Allergies  Allergen Reactions    Erythromycin Nausea And Vomiting    Isopropyl Alcohol Hives and Itching     WHEN PT GETS SHOTS- WIPE WITH RUBBING ALCOHOL, THEN WIPE THE RUBBING ALCOHOL OFF OF HER SKIN PRIOR TO ADMINISTERING THE SHOT  WHEN PT GETS SHOTS- WIPE WITH RUBBING ALCOHOL, THEN WIPE THE RUBBING ALCOHOL OFF OF HER SKIN PRIOR TO ADMINISTERING THE SHOT      Morphine And Codeine Hives, Itching and Swelling    Other Hives, Itching and Swelling     COPOLYMERS= GLUES, ADHESIVE, FOUND IN A LOT OF PRODUCTS  Copolymer      Tape Hives, Itching and Swelling    Azithromycin Itching and Nausea And Vomiting   [4] No past surgical history on file.  [5] No family history on file.  [6]   Social History  Tobacco Use    Smoking status: Never    Smokeless tobacco: Never   Vaping Use    Vaping status: Never Used   Substance Use Topics    Alcohol use: Yes     Comment: socially    Drug use: Yes     Types: Marijuana     Comment: gummy occasionally

## 2023-12-07 ENCOUNTER — Other Ambulatory Visit: Payer: Self-pay | Admitting: Internal Medicine

## 2023-12-26 ENCOUNTER — Telehealth (INDEPENDENT_AMBULATORY_CARE_PROVIDER_SITE_OTHER): Payer: Self-pay | Admitting: Registered Nurse

## 2023-12-26 NOTE — Telephone Encounter (Signed)
 Name of Requester: Patient     Name and Dosage of Rx: semaglutide  (OZEMPIC ) 4 MG/3ML     Temp Refill Request: Per patient requesting if medication can be refilled as last prescription has expired    Days left of Rx: 0    Date of Patient's Last OV:  11/23/2023    Requester's preferred ph: mobile on file    Patient available on portal: Yes    Please note: Per patient she usually picks up the order in office and takes it to a pharmacy to pick up, per patient she does not use the pharmacies on file. Per patient requesting to give a call back as she does not check her mychart

## 2023-12-26 NOTE — Telephone Encounter (Signed)
RN called patient mobile on file and helped schedule a PE with NP Sun on 01/08/24 at 9:30.

## 2024-01-05 ENCOUNTER — Other Ambulatory Visit: Payer: Self-pay | Admitting: Gastroenterology

## 2024-01-08 ENCOUNTER — Encounter (INDEPENDENT_AMBULATORY_CARE_PROVIDER_SITE_OTHER): Payer: Self-pay

## 2024-01-08 ENCOUNTER — Ambulatory Visit (INDEPENDENT_AMBULATORY_CARE_PROVIDER_SITE_OTHER): Payer: BC Managed Care – PPO

## 2024-01-08 VITALS — BP 90/60 | HR 84 | Temp 98.2°F | Resp 16 | Ht 64.25 in | Wt 145.5 lb

## 2024-01-08 DIAGNOSIS — Z131 Encounter for screening for diabetes mellitus: Secondary | ICD-10-CM

## 2024-01-08 DIAGNOSIS — Z13220 Encounter for screening for lipoid disorders: Secondary | ICD-10-CM

## 2024-01-08 DIAGNOSIS — K51019 Ulcerative (chronic) pancolitis with unspecified complications: Secondary | ICD-10-CM

## 2024-01-08 DIAGNOSIS — E663 Overweight: Secondary | ICD-10-CM

## 2024-01-08 DIAGNOSIS — Z853 Personal history of malignant neoplasm of breast: Secondary | ICD-10-CM | POA: Insufficient documentation

## 2024-01-08 DIAGNOSIS — Z0000 Encounter for general adult medical examination without abnormal findings: Secondary | ICD-10-CM

## 2024-01-08 MED ORDER — SEMAGLUTIDE (1 MG/DOSE) 4 MG/3ML SC SOPN: 1.0000 mg | PEN_INJECTOR | SUBCUTANEOUS | 3 refills | Status: DC

## 2024-01-08 NOTE — Progress Notes (Signed)
 [address]  [place], [place] [address]  Ph. [phone], [place] [phone]                Date of Exam: 01/08/2024 12:47 PM        Patient ID: Lindsey Cox is a 58 y.o. female.  Provider Onita Repress, FNP        Chief Complaint:    Chief Complaint   Patient presents with    Annual Exam     Fasting: yes  Pap: 2023, reportedly normal, hx of abnormal in the past  Mammogram: 12/07/2023  Colonoscopy: 2002 reportedly. Saw gastroenterologist and was told didn't need colonoscopy  Declining Tdap  Shingles done 2021, no record             HPI:    Pt presents today for a wellness exam. Patient is fasting.    Patient is taking ozempic  1 mg weekly injection for weight control and GI symptoms control.  Patient started with Wegovy  for weight loss. She kept gaining weight despite the effort of trying exercising, running marathon and lower daily calorie intake to 800 per day. She was switched to Ozempic  at certain point and it continues helps her with her symptoms.  Reports that she used to have high blood pressure, now her BP is normal.  She has ulcerative colitis and her symptoms have gotten much improved from 10-12 times of bowel movements per day to 3-4 times per day.   Reports that medication is miracle does things for me fixed problems that can't be explained   States that it is a life saver and she would like to continue with it.  She would like to get refills today and she pays out of pocket.    Patient sees oncologist for history of right breast cancer.   Stopped tamoxifen about 2-3 months ago and slowly weaned herself off clonidine  5 weeks ago.    Patient sees GI for ulcerative colitis and has lab orders to [facility]. Would like to get all the labs drawn together. Patient would request [facility] and/or GI office to fax the lab results to our office once available.     Depression Screening  PHQ2/9 score: No data recorded    Concerns for SI or HI: No    Vaccines:  - TDAP: declined  - Shingrix:  2021 per patient, up to date  - Flu:  up to date.  -COVID:  up to date.    Regular Screening  Eye exam: annually  Dental exam: Regularly  Exercise: 2-3 times per week  Diet: healthy  Excessive alcohol drinking:No  Smoking:Never  Drug: No  LMP: No LMP recorded. (Menstrual status: Menopausal)..   Contraception: post menopausal status, checked hormone last year and was   Sexually active: Yes, with one female partner  Concerns for STDs:No  Last pap smear: 2023, normal, done with OB/GYN: [facility] [place]  Last mammogram: 11/2023  Colorectal cancer screening: 2002, patient has history of large intestine surgery and she does not have large intestines. Sees GI and was told that she does not need colonoscopy anymore.            Problem List:    Problem List[1]          Current Meds:    Current Medications[2]         Allergies:    Allergies[3]        Past Medical History:      Medical History[4]  Past Surgical History:    Past Surgical History[5]        Family History:    Family History[6]        Social History:    Social History[7]        Sections Reviewed:       The following sections were reviewed this encounter by the provider:   Tobacco  Allergies  Meds  Problems  Med Hx  Surg Hx  Fam Hx             Vital Signs:    Visit Vitals  BP 90/60 (BP Site: Left arm, Patient Position: Sitting, Cuff Size: Medium)   Pulse 84   Temp 98.2 F (36.8 C) (Temporal)   Resp 16   Ht 1.632 m (5' 4.25)   Wt 66 kg (145 lb 8 oz)   BMI 24.78 kg/m             ROS:    Review of Systems   Constitutional:  Negative for fatigue and fever.   HENT:  Negative for ear pain, rhinorrhea and sore throat.    Respiratory:  Negative for cough and shortness of breath.    Cardiovascular:  Negative for chest pain.   Gastrointestinal:  Negative for abdominal pain and vomiting.   Genitourinary:  Negative for difficulty urinating.   Musculoskeletal:  Negative for back pain and neck pain.   Skin:  Negative for rash.   Neurological:  Negative for  dizziness and weakness.   Hematological:  Does not bruise/bleed easily.   Psychiatric/Behavioral:  Negative for dysphoric mood.               Physical Exam:    Physical Exam  Vitals and nursing note reviewed.   Constitutional:       Appearance: Normal appearance.   HENT:      Head: Normocephalic and atraumatic.      Right Ear: Tympanic membrane, ear canal and external ear normal.      Left Ear: Tympanic membrane, ear canal and external ear normal.      Nose: Nose normal.      Mouth/Throat:      Mouth: Mucous membranes are moist.   Eyes:      Extraocular Movements: Extraocular movements intact.      Pupils: Pupils are equal, round, and reactive to light.   Cardiovascular:      Rate and Rhythm: Normal rate and regular rhythm.      Heart sounds: Normal heart sounds.   Pulmonary:      Effort: Pulmonary effort is normal.      Breath sounds: Normal breath sounds.   Abdominal:      General: Abdomen is flat. Bowel sounds are normal.      Palpations: Abdomen is soft.   Genitourinary:     Comments: Exam deferred today since pt is not having any pain and has not felt any abnormalities  Discussed self examination  Recommend pt schedule an appointment if symptoms occur    Musculoskeletal:         General: Normal range of motion.      Cervical back: Neck supple.   Skin:     General: Skin is warm.      Capillary Refill: Capillary refill takes less than 2 seconds.   Neurological:      General: No focal deficit present.      Mental Status: She is alert and oriented to person, place, and time. Mental status is at  baseline.   Psychiatric:         Mood and Affect: Mood normal.         Thought Content: Thought content normal.         Judgment: Judgment normal.                Assessment:    1. Wellness examination    2. Overweight (BMI 25.0-29.9)  - semaglutide  (OZEMPIC ) 4 MG/3ML; Inject 1 mg into the skin once a week Patient to pay out of pocket, no prior authorization  Dispense: 9 mL; Refill: 3    3. Screening for diabetes mellitus  (DM)  - Hemoglobin A1C; Future  - Hemoglobin A1C    4. Screening for lipid disorders  - Lipid Panel; Future  - Lipid Panel    5. Ulcerative pancolitis with complication    6. History of right breast cancer            Plan:    General Wellness:   Check screening blood work - informed pt results will be available via MyChart  Continue to see GYN for breast exams, mammograms, and pap smears   Colon cancer screening: please follow up with GI.  Encouraged to regular exercise at least 3-4 times per week for 30-45 minutes per session of aerobic exercise  Encouraged healthy diet with 5-9 servings of fruits and vegetables per day  Encouraged dental exam every 6-12 months  Encouraged eye exam every 1-2 years  Encouraged adequate daily H2O intake  Education provided regarding safe sun practices and healthy skin care  Please continue to follow up with specialists.    Vaccines:   Declined Tdap vaccine today.  I recommend getting the flu shot.  ALSO, if you have not, please get the current Covid-19 booster- can get it at the same time as Flu vaccine    Go to any local pharmacy or grocery store -- you may be able to even schedule online    Weight management  Ozempic  refilled today for one year.    As posted in each exam room now that during a well physical- Any new acute health problems, uncontrolled chronic medical conditions, or office procedures that is done today are not considered part of the physical and that an additional copayment or deductible may apply.  Thank you for understanding.    All questions answered. Patient agreeable and amenable to plan. Patient verbalizes understanding.        Follow-up:    Return in about 1 year (around 01/07/2025) for annual physical exam or sooner if needed.         Onita Repress, FNP   *DISCLAIMER: This note was generated by the Epic EMR system/ Dragon speech recognition and may contain inherent errors or omissions not intended by the user. Grammatical errors, random word insertions, deletions,  pronoun errors and incomplete sentences are occasional consequences of this technology due to software limitations. Not all errors are caught or corrected. If there are questions or concerns about the content of this note or information contained within the body of this dictation they should be addressed directly with the author for clarification.*          [1]   Patient Active Problem List  Diagnosis    Contact dermatitis due to chemicals    Lateral epicondylitis, right elbow    Lymphedema    Neuropathy due to chemotherapeutic drug    Other allergic rhinitis    Overweight (BMI 25.0-29.9)  Primary cancer of upper outer quadrant of right female breast    Rib pain on right side    Tachycardia    Abnormal uterine bleeding    Vaginal dryness    Ulcerative pancolitis with complication    History of right breast cancer   [2]   Current Outpatient Medications   Medication Sig Dispense Refill    Calcium-Magnesium-Vitamin D (CALCIUM MAGNESIUM PO) Take 1 tablet by mouth daily      LORazepam  (ATIVAN ) 1 MG tablet Take 1 tablet (1 mg) by mouth every 8 (eight) hours as needed (nausea) (Patient not taking: Reported on 01/08/2024) 10 tablet 0    semaglutide  (OZEMPIC ) 4 MG/3ML Inject 1 mg into the skin once a week Patient to pay out of pocket, no prior authorization 9 mL 3     No current facility-administered medications for this visit.   [3]   Allergies  Allergen Reactions    Erythromycin Nausea And Vomiting    Isopropyl Alcohol Hives and Itching     WHEN PT GETS SHOTS- WIPE WITH RUBBING ALCOHOL, THEN WIPE THE RUBBING ALCOHOL OFF OF HER SKIN PRIOR TO ADMINISTERING THE SHOT  WHEN PT GETS SHOTS- WIPE WITH RUBBING ALCOHOL, THEN WIPE THE RUBBING ALCOHOL OFF OF HER SKIN PRIOR TO ADMINISTERING THE SHOT      Morphine And Codeine Hives, Itching and Swelling    Other Hives, Itching and Swelling     COPOLYMERS= GLUES, ADHESIVE, FOUND IN A LOT OF PRODUCTS  Copolymer      Tape Hives, Itching and Swelling    Azithromycin Itching and Nausea And  Vomiting   [4] History reviewed. No pertinent past medical history.  [5] History reviewed. No pertinent surgical history.  [6] No family history on file.  [7]   Social History  Tobacco Use    Smoking status: Never    Smokeless tobacco: Never   Vaping Use    Vaping status: Never Used   Substance Use Topics    Alcohol use: Yes     Comment: socially    Drug use: Yes     Types: Marijuana     Comment: gummy occasionally

## 2024-01-08 NOTE — Patient Instructions (Addendum)
 RN discussed tdap with patient. Patient declines vaccination at this time.  Healthy Lifestyle    A healthy lifestyle can help you feel good, stay at a healthy weight, and have plenty of energy for both work and play. A healthy lifestyle is something you can share with your whole family.    A healthy lifestyle also can lower your risk for serious health problems, such as high blood pressure, heart disease, and diabetes.    You can follow a few steps listed below to improve your health and the health of your family.    Follow-up care is a key part of your treatment and safety. Be sure to make and go to all appointments, and call your doctor if you are having problems. It's also a good idea to know your test results and keep a list of the medicines you take.    How can you care for yourself at home?  Do not eat too much sugar, fat, or fast foods. You can still have dessert and treats now and then. The goal is moderation.  Start small to improve your eating habits. Pay attention to portion sizes, drink less juice and soda pop, and eat more fruits and vegetables.  Eat a healthy amount of food. A 3-ounce serving of meat, for example, is about the size of a deck of cards. Fill the rest of your plate with vegetables and whole grains.  Limit the amount of soda and sports drinks you have every day. Drink more water when you are thirsty.  Eat at least 5 servings of fruits and vegetables every day. It may seem like a lot, but it is not hard to reach this goal. A serving or helping is 1 piece of fruit, 1 cup of vegetables, or 2 cups of leafy, raw vegetables. Have an apple or some carrot sticks as an afternoon snack instead of a candy bar. Try to have fruits and/or vegetables at every meal.  Make exercise part of your daily routine. You may want to start with simple activities, such as walking, bicycling, or slow swimming. Try to be active 30 to 60 minutes every day. You do not need to do all 30 to 60 minutes all at once. For  example, you can exercise 3 times a day for 10 or 20 minutes. Moderate exercise is safe for most people, but it is always a good idea to talk to your doctor before starting an exercise program.  Keep moving. Mow the lawn, work in the garden, or [employer]. Take the stairs instead of the elevator at work.  If you smoke, quit. People who smoke have an increased risk for heart attack, stroke, cancer, and other lung illnesses. Quitting is hard, but there are ways to boost your chance of quitting tobacco for good.  Use nicotine gum, patches, or lozenges.  Ask your doctor about stop-smoking programs and medicines.  Keep trying.  In addition to reducing your risk of diseases in the future, you will notice some benefits soon after you stop using tobacco. If you have shortness of breath or asthma symptoms, they will likely get better within a few weeks after you quit.  Limit how much alcohol you drink. Moderate amounts of alcohol (up to 2 drinks a day for men, 1 drink a day for women) are okay. But drinking too much can lead to liver problems, high blood pressure, and other health problems.        Family health  If you have  a family, there are many things you can do together to improve your health.    Eat meals together as a family as often as possible.  Eat healthy foods. This includes fruits, vegetables, lean meats and dairy, and whole grains.  Include your family in your fitness plan. Most people think of activities such as jogging or tennis as the way to fitness, but there are many ways you and your family can be more active. Anything that makes you breathe hard and gets your heart pumping is exercise. Here are some tips:  Walk to do errands or to take your child to school or the bus.  Go for a family bike ride after dinner instead of watching TV.        [employer] Healthy Eating Plate        Copyright  2011, [employer]. For more information about The Healthy Eating Plate, please see The Nutrition Source,  Department of Nutrition, [employer], www.thenutritionsource.org, and [employer], www.health.ukrank.hu.

## 2024-01-11 LAB — LIPID PANEL
Cholesterol / HDL Ratio: 2.5 ratio (ref 0.0–4.4)
Cholesterol: 171 mg/dL (ref 100–199)
HDL: 68 mg/dL (ref >39)
LDL Chol Calculated (NIH): 86 mg/dL (ref 0–99)
Triglycerides: 93 mg/dL (ref 0–149)
VLDL Calculated: 17 mg/dL (ref 5–40)

## 2024-01-11 LAB — HEMOGLOBIN A1C: Hemoglobin A1C: 5.4 % (ref 4.8–5.6)

## 2024-02-08 ENCOUNTER — Encounter (INDEPENDENT_AMBULATORY_CARE_PROVIDER_SITE_OTHER): Payer: Self-pay | Admitting: Registered Nurse

## 2024-06-28 ENCOUNTER — Telehealth (INDEPENDENT_AMBULATORY_CARE_PROVIDER_SITE_OTHER): Payer: Self-pay | Admitting: Family Medicine

## 2024-12-10 ENCOUNTER — Telehealth (INDEPENDENT_AMBULATORY_CARE_PROVIDER_SITE_OTHER): Payer: Self-pay | Admitting: Registered Nurse

## 2024-12-10 NOTE — Telephone Encounter (Signed)
 error

## 2024-12-10 NOTE — Telephone Encounter (Signed)
 Name of Requester: patient    Name and Dosage of Rx: semaglutide  (OZEMPIC ) 4 MG/3ML     [Temp Refill Request:] Per pt has upcoming PE 01/06/2025 as that is the one day pt has off. Per pt requesting for temp rx refill for prescription above. Usually uses a mail order supply, but is requesting to have prescription electronically sent to pt so that pt can choose to bring to [facility]. Pt has not chosen [facility] yet    Days left of Rx: requesting 1 month, but unsure how much is left    Preferred Pharmacy name:n/a    Preferred Pharmacy ph:n/a    Date of Patient's Last (OV/VV/PE):     Requester's preferred ph: [facility] [address]    Patient available on portal: yrd

## 2024-12-11 ENCOUNTER — Encounter (INDEPENDENT_AMBULATORY_CARE_PROVIDER_SITE_OTHER): Payer: Self-pay

## 2024-12-18 ENCOUNTER — Ambulatory Visit (INDEPENDENT_AMBULATORY_CARE_PROVIDER_SITE_OTHER)

## 2024-12-18 ENCOUNTER — Encounter (INDEPENDENT_AMBULATORY_CARE_PROVIDER_SITE_OTHER): Payer: Self-pay

## 2024-12-18 VITALS — BP 100/72 | HR 76 | Temp 97.2°F | Resp 16 | Ht 64.25 in | Wt 158.0 lb

## 2024-12-18 DIAGNOSIS — M810 Age-related osteoporosis without current pathological fracture: Secondary | ICD-10-CM

## 2024-12-18 DIAGNOSIS — Z13220 Encounter for screening for lipoid disorders: Secondary | ICD-10-CM

## 2024-12-18 DIAGNOSIS — Z13228 Encounter for screening for other metabolic disorders: Secondary | ICD-10-CM

## 2024-12-18 DIAGNOSIS — K5100 Ulcerative (chronic) pancolitis without complications: Secondary | ICD-10-CM

## 2024-12-18 DIAGNOSIS — Z1329 Encounter for screening for other suspected endocrine disorder: Secondary | ICD-10-CM

## 2024-12-18 DIAGNOSIS — Z0000 Encounter for general adult medical examination without abnormal findings: Secondary | ICD-10-CM

## 2024-12-18 DIAGNOSIS — Z131 Encounter for screening for diabetes mellitus: Secondary | ICD-10-CM

## 2024-12-18 DIAGNOSIS — E663 Overweight: Secondary | ICD-10-CM

## 2024-12-18 DIAGNOSIS — Z1231 Encounter for screening mammogram for malignant neoplasm of breast: Secondary | ICD-10-CM

## 2024-12-18 DIAGNOSIS — Z130 Encounter for screening for diseases of the blood and blood-forming organs and certain disorders involving the immune mechanism: Secondary | ICD-10-CM

## 2024-12-18 MED ORDER — SEMAGLUTIDE (1 MG/DOSE) 4 MG/3ML SC SOPN: 1.0000 mg | PEN_INJECTOR | SUBCUTANEOUS | 2 refills | Status: DC

## 2024-12-18 MED ORDER — SEMAGLUTIDE (2 MG/DOSE) 8 MG/3ML SC SOPN: 2.0000 mg | PEN_INJECTOR | SUBCUTANEOUS | 0 refills | Status: DC

## 2024-12-18 MED ORDER — SEMAGLUTIDE (2 MG/DOSE) 8 MG/3ML SC SOPN: 2.0000 mg | PEN_INJECTOR | SUBCUTANEOUS | 3 refills | Status: AC

## 2024-12-18 NOTE — Progress Notes (Addendum)
 [address]  [place], [place] [address]  Ph. [phone], Fax [phone]          Subjective     CC:  Chief Complaint   Patient presents with    Annual Exam     Fasting : yes  Pap : 09/2021  Colonoscopy : per pt she have not colon   Mammo : 12/07/2023.       HPI:  History of Present Illness  Lindsey Cox is a 59 year old female who presents for an annual physical exam.    Breast cancer surveillance  - History of breast cancer treated with lumpectomy, radiation, and chemotherapy.  - Continues annual mammograms, most recently performed last January.  - Family history of breast cancer in her sister, who recently underwent lumpectomy.    Ulcerative colitis status post proctocolectomy  - Status post proctocolectomy with J-pouch approximately 20 years ago.  - Uncertain about recommended routine surveillance; recently learned pouchoscopies are indicated rather than colonoscopies.  - Follows with gastroenterology primarily when issues arise.    Weight management  - Uses Ozempic  1 mg weekly for weight management after prior use of Wegovy .  - Experiencing a weight loss plateau and perceives her body as out of balance.  - Concerned about decreasing effectiveness of medication despite low caloric intake and high activity.    Osteoporosis  - Developed osteoporosis following chemotherapy.  - Increased dairy intake and regularly consumes Greek yogurt and chicken to support bone health.    Long covid and recent respiratory illness  - Long COVID with persistent nausea, managed with lorazepam .  - Recent coughing cold lasting approximately two weeks, resolved one week ago.    PAP  -Last one 2022, normal  -Due in 2027    Physical activity and lifestyle  - Previously ran marathons but has markedly reduced exercise due to burnout and physical limitations.  - Maintains a generally healthy diet with her husband.    ROS:   Review of Systems   Constitutional:  Negative for activity change, appetite change, chills,  fatigue and fever.   HENT:  Negative for congestion and rhinorrhea.    Gastrointestinal:  Negative for abdominal pain, diarrhea, nausea and vomiting.   Endocrine: Negative for cold intolerance, heat intolerance, polydipsia, polyphagia and polyuria.   Genitourinary:  Negative for dysuria, frequency, genital sores, hematuria, pelvic pain and urgency.   Musculoskeletal:  Negative for arthralgias and myalgias.   Skin:  Negative for color change and rash.   Neurological:  Negative for dizziness, light-headedness, numbness and headaches.   Psychiatric/Behavioral:  Negative for decreased concentration and dysphoric mood. The patient is not nervous/anxious.               Objective     Visit Vitals  BP 100/72 (BP Site: Left arm, Patient Position: Sitting, Cuff Size: Medium)   Pulse 76   Temp 97.2 F (36.2 C) (Temporal)   Resp 16   Ht 1.632 m (5' 4.25)   Wt 71.7 kg (158 lb)   BMI 26.91 kg/m       Physical Exam  Vitals and nursing note reviewed.   Constitutional:       Appearance: Normal appearance.   HENT:      Head: Normocephalic.      Right Ear: Tympanic membrane, ear canal and external ear normal.      Left Ear: Tympanic membrane, ear canal and external ear normal.      Nose: Nose normal.  Mouth/Throat:      Mouth: Mucous membranes are moist.      Pharynx: Oropharynx is clear.   Eyes:      Extraocular Movements: Extraocular movements intact.      Conjunctiva/sclera: Conjunctivae normal.      Pupils: Pupils are equal, round, and reactive to light.   Cardiovascular:      Rate and Rhythm: Normal rate and regular rhythm.      Pulses: Normal pulses.      Heart sounds: Normal heart sounds.   Pulmonary:      Effort: Pulmonary effort is normal.      Breath sounds: Normal breath sounds.   Abdominal:      General: Abdomen is flat.      Palpations: Abdomen is soft.   Musculoskeletal:      Cervical back: Normal range of motion and neck supple.   Skin:     General: Skin is warm and dry.      Capillary Refill: Capillary refill  takes less than 2 seconds.   Neurological:      General: No focal deficit present.      Mental Status: She is alert and oriented to person, place, and time.   Psychiatric:         Mood and Affect: Mood normal.         Behavior: Behavior normal.                Assessment/Plan     Assessment & Plan  Wellness examination  Routine health maintenance discussed, including screenings and lifestyle modifications.   Emphasized importance of regular eye and dental check-ups.  - Ordered annual blood work.  - Continue annual eye exams.  - Continue dental check-ups every 6 months.  Orders:    CBC with Differential (Order); Future    Comprehensive Metabolic Panel; Future    Lipid Panel; Future    Hemoglobin A1C; Future    Thyroid  Cascade Profile; Future    Mammo Screening 3D/Tomo Bilateral; Future    Ulcerative pancolitis (CMS/HCC)  Ulcerative colitis post-proctocolectomy with J-pouch (approx. 20 years ago)  Due to removal of entire colon and rectum, no longer requires colonoscopies, but undergoes pouchoscopies for surveillance.  - Continue regular follow-up with gastroenterologist as needed.       Overweight (BMI 25.0-29.9)  Weight management plateau despite Ozempic  1 mg weekly.   Discussed dose increase to 2 mg weekly, but hesitant due to past adverse reactions with other GLP-1 medication dosage increases   Emphasized protein intake, exercise, and healthy lifestyle.  - Continue Ozempic  1 mg weekly currently, prescribed 2 mg for initiation next month (February 2026); contingent on gastroenterologist's approval.  - Encouraged high protein diet and regular exercise.  Orders:    semaglutide  (OZEMPIC ) 8 MG/3ML; Inject 2 mg into the skin once a week    Osteoporosis without current pathological fracture, unspecified osteoporosis type  Most recent DEXA 12/2022, osteoporosis  Likely chemotherapy induced   Reports increasing dairy intake to support bone health.  -Future consideration of referral to Rheumatology       Screening mammogram  for breast cancer  History of Right breast cancer  Treated with lumpectomy, radiation, and chemotherapy.   Regular mammograms part of ongoing surveillance.  - Ordered annual mammogram.  Orders:    Mammo Screening 3D/Tomo Bilateral; Future    Screening for iron deficiency anemia    Orders:    CBC with Differential (Order); Future    Screening for diabetes mellitus  Orders:    Comprehensive Metabolic Panel; Future    Hemoglobin A1C; Future    Screening for metabolic disorder    Orders:    Comprehensive Metabolic Panel; Future    Screening for lipid disorders    Orders:    Lipid Panel; Future    Screening for thyroid  disorder    Orders:    Thyroid  Cascade Profile; Future             Health Maintenance  Get at least 150 minutes per week of moderate-intensity aerobic activity   Eat a healthy diet with plenty of fruits and vegetables; includes lean meats, poultry, fish, beans, and nuts; low in saturated fats, trans fats, cholesterol, salt (sodium), and added sugars  Encouraged adequate daily H2O intake  Encouraged dental exam every 6-12 months  Encouraged eye exam every 1-2 years  Education provided regarding safe sun practices and healthy skin care  Call or follow up with any questions/concerns      Follow-up in 1 year for Annual Physical unless indicated sooner.  Recording duration: 35 minutes      Verbal consent obtained to record this visit.     Please do not hesitate to call or send a message via MyChart for any questions or concerns.    Clotilda Jubilee, PA-C

## 2024-12-18 NOTE — Assessment & Plan Note (Addendum)
 Weight management plateau despite Ozempic  1 mg weekly.   Discussed dose increase to 2 mg weekly, but hesitant due to past adverse reactions with other GLP-1 medication dosage increases   Emphasized protein intake, exercise, and healthy lifestyle.  - Continue Ozempic  1 mg weekly currently, prescribed 2 mg for initiation next month (February 2026); contingent on gastroenterologist's approval.  - Encouraged high protein diet and regular exercise.  Orders:    semaglutide  (OZEMPIC ) 8 MG/3ML; Inject 2 mg into the skin once a week

## 2024-12-18 NOTE — Patient Instructions (Signed)
 RN discussed vaccine with patient. Patient declines PCV 20 vaccination at this time. Vaccine Information Statement provided to patient.

## 2024-12-19 ENCOUNTER — Ambulatory Visit (INDEPENDENT_AMBULATORY_CARE_PROVIDER_SITE_OTHER): Payer: Self-pay

## 2024-12-19 LAB — COMPREHENSIVE METABOLIC PANEL
ALT: 15 [IU]/L (ref 0–32)
AST (SGOT): 22 [IU]/L (ref 0–40)
Albumin: 4.3 g/dL (ref 3.8–4.9)
Alkaline Phosphatase: 69 [IU]/L (ref 49–135)
BUN / Creatinine Ratio: 17 (ref 9–23)
BUN: 12 mg/dL (ref 6–24)
Bilirubin, Total: 0.5 mg/dL (ref 0.0–1.2)
CO2: 21 mmol/L (ref 20–29)
Calcium: 9.4 mg/dL (ref 8.7–10.2)
Chloride: 105 mmol/L (ref 96–106)
Creatinine: 0.72 mg/dL (ref 0.57–1.00)
Globulin, Total: 2.4 g/dL (ref 1.5–4.5)
Glucose: 85 mg/dL (ref 70–99)
Potassium: 4.1 mmol/L (ref 3.5–5.2)
Protein, Total: 6.7 g/dL (ref 6.0–8.5)
Sodium: 141 mmol/L (ref 134–144)
eGFR: 98 mL/min/{1.73_m2} (ref >59)

## 2024-12-19 LAB — LIPID PANEL
Cholesterol / HDL Ratio: 2.3 ratio (ref 0.0–4.4)
Cholesterol: 180 mg/dL (ref 100–199)
HDL: 79 mg/dL (ref >39)
LDL Chol Calculated (NIH): 89 mg/dL (ref 0–99)
Triglycerides: 62 mg/dL (ref 0–149)
VLDL Calculated: 12 mg/dL (ref 5–40)

## 2024-12-19 LAB — CBC AND DIFFERENTIAL
Baso(Absolute): 0.1 10*3/uL (ref 0.0–0.2)
Basophils Automated: 1 %
Eosinophils Absolute: 0.2 10*3/uL (ref 0.0–0.4)
Eosinophils Automated: 4 %
Hematocrit: 40.9 % (ref 34.0–46.6)
Hemoglobin: 13.1 g/dL (ref 11.1–15.9)
Immature Granulocytes Absolute: 0 10*3/uL (ref 0.0–0.1)
Immature Granulocytes: 0 %
Lymphocytes Absolute: 1.8 10*3/uL (ref 0.7–3.1)
Lymphocytes Automated: 41 %
MCH: 30.6 pg (ref 26.6–33.0)
MCHC: 32 g/dL (ref 31.5–35.7)
MCV: 96 fL (ref 79–97)
Monocytes Absolute: 0.3 10*3/uL (ref 0.1–0.9)
Monocytes: 7 %
Neutrophils Absolute Count: 2.1 10*3/uL (ref 1.4–7.0)
Neutrophils: 47 %
Platelets: 315 10*3/uL (ref 150–450)
RBC: 4.28 x10E6/uL (ref 3.77–5.28)
RDW: 12.9 % (ref 11.7–15.4)
WBC: 4.4 10*3/uL (ref 3.4–10.8)

## 2024-12-19 LAB — HEMOGLOBIN A1C: Hemoglobin A1C: 5.4 % (ref 4.8–5.6)

## 2024-12-19 LAB — THYROID CASCADE PROFILE: TSH: 1.81 u[IU]/mL (ref 0.450–4.500)

## 2025-01-06 ENCOUNTER — Encounter (INDEPENDENT_AMBULATORY_CARE_PROVIDER_SITE_OTHER)

## 2025-01-06 ENCOUNTER — Encounter (INDEPENDENT_AMBULATORY_CARE_PROVIDER_SITE_OTHER): Admitting: Registered Nurse
